# Patient Record
Sex: Female | Born: 1960
Health system: Southern US, Community
[De-identification: ages and names within clinical notes are randomized; demographics above are authoritative.]

## PROBLEM LIST (undated history)

## (undated) DIAGNOSIS — J302 Other seasonal allergic rhinitis: Secondary | ICD-10-CM

## (undated) DIAGNOSIS — G500 Trigeminal neuralgia: Secondary | ICD-10-CM

## (undated) DIAGNOSIS — M1990 Unspecified osteoarthritis, unspecified site: Secondary | ICD-10-CM

## (undated) DIAGNOSIS — Z9851 Tubal ligation status: Secondary | ICD-10-CM

## (undated) DIAGNOSIS — I10 Essential (primary) hypertension: Secondary | ICD-10-CM

## (undated) DIAGNOSIS — F419 Anxiety disorder, unspecified: Secondary | ICD-10-CM

## (undated) DIAGNOSIS — E785 Hyperlipidemia, unspecified: Secondary | ICD-10-CM

## (undated) DIAGNOSIS — N189 Chronic kidney disease, unspecified: Secondary | ICD-10-CM

## (undated) HISTORY — DX: Trigeminal neuralgia: G50.0

## (undated) HISTORY — PX: TUBAL LIGATION: SHX77

## (undated) HISTORY — PX: DENTAL SURGERY: SHX609

## (undated) HISTORY — PX: OTHER SURGICAL HISTORY: SHX169

---

## 2011-04-01 ENCOUNTER — Emergency Department (HOSPITAL_COMMUNITY): Payer: 59

## 2011-04-01 ENCOUNTER — Encounter (HOSPITAL_COMMUNITY): Payer: Self-pay

## 2011-04-01 ENCOUNTER — Emergency Department (HOSPITAL_COMMUNITY)
Admission: EM | Admit: 2011-04-01 | Discharge: 2011-04-01 | Disposition: A | Discharge status: Home / Self Care | Payer: 59 | Attending: Emergency Medicine | Admitting: Emergency Medicine

## 2011-04-01 DIAGNOSIS — E789 Disorder of lipoprotein metabolism, unspecified: Secondary | ICD-10-CM | POA: Insufficient documentation

## 2011-04-01 DIAGNOSIS — E669 Obesity, unspecified: Secondary | ICD-10-CM | POA: Insufficient documentation

## 2011-04-01 DIAGNOSIS — Z79899 Other long term (current) drug therapy: Secondary | ICD-10-CM | POA: Insufficient documentation

## 2011-04-01 DIAGNOSIS — R10814 Left lower quadrant abdominal tenderness: Secondary | ICD-10-CM | POA: Insufficient documentation

## 2011-04-01 DIAGNOSIS — M545 Low back pain, unspecified: Secondary | ICD-10-CM | POA: Insufficient documentation

## 2011-04-01 DIAGNOSIS — R112 Nausea with vomiting, unspecified: Secondary | ICD-10-CM | POA: Insufficient documentation

## 2011-04-01 DIAGNOSIS — R109 Unspecified abdominal pain: Secondary | ICD-10-CM | POA: Insufficient documentation

## 2011-04-01 DIAGNOSIS — R350 Frequency of micturition: Secondary | ICD-10-CM | POA: Insufficient documentation

## 2011-04-01 DIAGNOSIS — N83209 Unspecified ovarian cyst, unspecified side: Secondary | ICD-10-CM | POA: Insufficient documentation

## 2011-04-01 DIAGNOSIS — N949 Unspecified condition associated with female genital organs and menstrual cycle: Secondary | ICD-10-CM | POA: Insufficient documentation

## 2011-04-01 DIAGNOSIS — N200 Calculus of kidney: Secondary | ICD-10-CM | POA: Insufficient documentation

## 2011-04-01 DIAGNOSIS — I10 Essential (primary) hypertension: Secondary | ICD-10-CM | POA: Insufficient documentation

## 2011-04-01 HISTORY — DX: Essential (primary) hypertension: I10

## 2011-04-01 HISTORY — DX: Tubal ligation status: Z98.51

## 2011-04-01 LAB — URINE MICROSCOPIC-ADD ON

## 2011-04-01 LAB — BASIC METABOLIC PANEL
BUN: 8 mg/dL (ref 6–23)
Calcium: 9 mg/dL (ref 8.4–10.5)
Creatinine, Ser: 0.59 mg/dL (ref 0.50–1.10)
GFR calc non Af Amer: >60 mL/min (ref >60)
Glucose, Bld: 167 mg/dL — ABNORMAL HIGH (ref 70–99)
Sodium: 136 mEq/L (ref 135–145)

## 2011-04-01 LAB — CBC
MCV: 88.7 fL (ref 78.0–100.0)
Platelets: 366 10*3/uL (ref 150–400)
RBC: 4.69 MIL/uL (ref 3.87–5.11)
WBC: 14.6 10*3/uL — ABNORMAL HIGH (ref 4.0–10.5)

## 2011-04-01 LAB — DIFFERENTIAL
Eosinophils Absolute: 0 10*3/uL (ref 0.0–0.7)
Lymphocytes Relative: 10 % — ABNORMAL LOW (ref 12–46)
Lymphs Abs: 1.5 10*3/uL (ref 0.7–4.0)
Neutrophils Relative %: 85 % — ABNORMAL HIGH (ref 43–77)

## 2011-04-01 LAB — URINALYSIS, ROUTINE W REFLEX MICROSCOPIC
Bilirubin Urine: NEGATIVE
Leukocytes, UA: NEGATIVE
Nitrite: NEGATIVE
Specific Gravity, Urine: 1.026 (ref 1.005–1.030)
pH: 6 (ref 5.0–8.0)

## 2011-06-26 ENCOUNTER — Encounter (HOSPITAL_COMMUNITY): Payer: Self-pay

## 2011-06-26 ENCOUNTER — Other Ambulatory Visit: Payer: Self-pay

## 2011-06-26 ENCOUNTER — Encounter (HOSPITAL_COMMUNITY)
Admission: RE | Admit: 2011-06-26 | Discharge: 2011-06-26 | Disposition: A | Discharge status: Home / Self Care | Payer: 59 | Source: Ambulatory Visit | Attending: Obstetrics and Gynecology | Admitting: Obstetrics and Gynecology

## 2011-06-26 HISTORY — DX: Hyperlipidemia, unspecified: E78.5

## 2011-06-26 HISTORY — DX: Anxiety disorder, unspecified: F41.9

## 2011-06-26 HISTORY — DX: Chronic kidney disease, unspecified: N18.9

## 2011-06-26 HISTORY — DX: Other seasonal allergic rhinitis: J30.2

## 2011-06-26 HISTORY — DX: Unspecified osteoarthritis, unspecified site: M19.90

## 2011-06-26 LAB — BASIC METABOLIC PANEL
BUN: 8 mg/dL (ref 6–23)
Calcium: 9.9 mg/dL (ref 8.4–10.5)
Creatinine, Ser: 0.64 mg/dL (ref 0.50–1.10)
GFR calc Af Amer: >90 mL/min (ref >90)
GFR calc non Af Amer: >90 mL/min (ref >90)

## 2011-06-26 LAB — CBC
MCH: 32.1 pg (ref 26.0–34.0)
MCHC: 34.2 g/dL (ref 30.0–36.0)
RDW: 13.4 % (ref 11.5–15.5)

## 2011-06-26 NOTE — Patient Instructions (Signed)
   Your procedure is scheduled on:  Wednesday, Oct. 31st  Enter through the Main Entrance of [facility] at: 6:00am Pick up the phone at the desk and dial [phone] and inform us of your arrival.  Please call this number if you have any problems the morning of surgery: [phone]  Remember: Do not eat food after midnight:  Tuesday Do not drink clear liquids after:  Tuesday Take these medicines the morning of surgery with a SIP OF WATER: None.  Withhold Tuesday night dose of Metformin.   Do not wear jewelry, make-up, or FINGER nail polish Do not wear lotions, powders, or perfumes.  You may wear deodorant. Do not shave 48 hours prior to surgery. Do not bring valuables to the hospital.  Patients discharged on the day of surgery will not be allowed to drive home.   Name and phone number of your driver:  husband Kristine Horne  cell [phone]  Remember to use your hibiclens as instructed.Please shower with 1/2 bottle the evening before your surgery and the other 1/2 bottle the morning of surgery.

## 2011-06-26 NOTE — Pre-Procedure Instructions (Signed)
Called Dr Rodman Pickle.  Patient's BP at  beginning of pre-op appointment was 177/104.  Rechecked BP 15 minutes later with large cuff - 171/104.  Rechecked BP again at end of pre-op appointment - 171/99.  Per Dr Delsa Grana instruction, patient instructed to follow up with her primary care physician regarding blood pressure/meds.  Also instructed patient that she will need to come back to Women's week of surgery for [place] to recheck her BP. Appointment made for Tuesday, Oct 30th for me to recheck BP at 7:00am.  Gaylyn Rong made aware of appointment and will inform admitting.  EKG done - NSR.  Instructed to bring albuterol inhaler  DOS but her inhaler was expired.  Will get patient inhaler DOS.  Patient will take all night time meds on Tuesday night but will withhold Tuesday's night dose of Metformin.   Patient will stop fish oil now until after surgery.  Patient informed that unless her BP is controlled, her surgery may be cancelled depending on Tuesday's Oct 30th reading.  Patient advised to take all her BP meds as prescribed.

## 2011-07-07 NOTE — H&P (Signed)
Kristine Horne is an 50 y.o. female, followed by Dr. Ellyn Horne for an 8 cm pelvic cyst.  Repeat pelvic u/s in September confirms the continued presence of an 8 cm simple pelvic cyst and possible arcuate or septate uterus with a possible polyp in the left horn.  She has had slightly irregular, heavy menses, some pelvic pain which may be from this cyst.  Due to the continued presence of this cyst, the patient wishes to proceed with surgical removal before Dr. Ellyn Horne returns from maternity leave.  Pertinent Gynecological History: Last mammogram: normal Date: 2 years ago Last pap: normal Date: 2 months ago OB History: G2, P1011   Menstrual History: Menarche age: 54 No LMP recorded.    Past Medical History  Diagnosis Date  . Hypertension   . S/P tubal ligation   . Diabetes mellitus   . Chronic kidney disease     hx kidney stone  . Hyperlipidemia   . Asthma   . Seasonal allergies   . Anxiety     no meds  . Arthritis     ankles    Past Surgical History  Procedure Date  . Tubal ligation   . Svd     x 1  . Dental surgery     upper bridge    No family history on file.  Social History:  reports that she quit smoking about 26 years ago. Her smoking use included Cigarettes. She has a 12 pack-year smoking history. She has never used smokeless tobacco. She reports that she drinks alcohol. She reports that she does not use illicit drugs.  Allergies:  Allergies  Allergen Reactions  . Erythromycin Nausea And Vomiting  . Tetracyclines & Related Hives  . Ibuprofen (Advil) Swelling  . Penicillins Swelling  . Sulfa Antibiotics Hives    No prescriptions prior to admission    Review of Systems  Respiratory: Negative.   Cardiovascular: Negative.   Gastrointestinal: Negative.   Genitourinary: Negative.     There were no vitals taken for this visit. Physical Exam  Constitutional: She appears well-developed and well-nourished.  Neck: Neck supple. No thyromegaly present.    Cardiovascular: Normal rate, regular rhythm and normal heart sounds.   No murmur heard. Respiratory: Breath sounds normal. No respiratory distress. She has no wheezes.  GI: Soft. She exhibits no distension. There is no tenderness.  Genitourinary: Vagina normal and uterus normal.       Exam by Dr. Ellyn Horne does not mention any palpable mass    No results found for this or any previous visit (from the past 24 hour(s)).  No results found.  Assessment/Plan: Persistent 8 cm midline pelvic cyst, possible endometrial polyp in an arcuate or septate uterus.  All medical and surgical options were discussed.  The surgical procedures, risks, alternatives and chances for resolving symptoms were also discussed.  Will admit on 10-31 for open laparoscopy with removal of pelvic cyst, possible BSO, possible laparotomy, hysteroscopy with D&C, possible resection.  Kristine Horne D 07/07/2011, 6:19 PM

## 2011-07-07 NOTE — Pre-Procedure Instructions (Signed)
Dr Rodman Pickle informed of patient's BP 161/91.  Ok for surgery tomorrow 07/08/11.  Patient reminded to take BP meds.  Patient verbalized understanding.

## 2011-07-08 ENCOUNTER — Encounter (HOSPITAL_COMMUNITY): Payer: Self-pay | Admitting: Anesthesiology

## 2011-07-08 ENCOUNTER — Other Ambulatory Visit: Payer: Self-pay | Admitting: Obstetrics and Gynecology

## 2011-07-08 ENCOUNTER — Ambulatory Visit (HOSPITAL_COMMUNITY)
Admission: RE | Admit: 2011-07-08 | Discharge: 2011-07-08 | Disposition: A | Discharge status: Home / Self Care | Payer: 59 | Source: Ambulatory Visit | Attending: Obstetrics and Gynecology | Admitting: Obstetrics and Gynecology

## 2011-07-08 ENCOUNTER — Ambulatory Visit (HOSPITAL_COMMUNITY): Payer: 59 | Admitting: Anesthesiology

## 2011-07-08 ENCOUNTER — Encounter (HOSPITAL_COMMUNITY)
Admission: RE | Disposition: A | Discharge status: Home / Self Care | Payer: Self-pay | Source: Ambulatory Visit | Attending: Obstetrics and Gynecology

## 2011-07-08 DIAGNOSIS — Z01812 Encounter for preprocedural laboratory examination: Secondary | ICD-10-CM | POA: Insufficient documentation

## 2011-07-08 DIAGNOSIS — Z01818 Encounter for other preprocedural examination: Secondary | ICD-10-CM | POA: Insufficient documentation

## 2011-07-08 DIAGNOSIS — N9489 Other specified conditions associated with female genital organs and menstrual cycle: Secondary | ICD-10-CM | POA: Insufficient documentation

## 2011-07-08 DIAGNOSIS — IMO0002 Reserved for concepts with insufficient information to code with codable children: Secondary | ICD-10-CM | POA: Diagnosis present

## 2011-07-08 DIAGNOSIS — D279 Benign neoplasm of unspecified ovary: Secondary | ICD-10-CM | POA: Insufficient documentation

## 2011-07-08 HISTORY — PX: LAPAROSCOPY: SHX197

## 2011-07-08 HISTORY — PX: OVARIAN CYST REMOVAL: SHX89

## 2011-07-08 LAB — PREGNANCY, URINE: Preg Test, Ur: NEGATIVE

## 2011-07-08 SURGERY — DILATATION & CURETTAGE/HYSTEROSCOPY WITH RESECTOCOPE
Anesthesia: General

## 2011-07-08 MED ORDER — MIDAZOLAM HCL 5 MG/5ML IJ SOLN
INTRAMUSCULAR | Status: DC | PRN
  Administered 2011-07-08: 2 mg via INTRAVENOUS

## 2011-07-08 MED ORDER — BUPIVACAINE HCL (PF) 0.25 % IJ SOLN
INTRAMUSCULAR | Status: DC | PRN
  Administered 2011-07-08: 10 mL

## 2011-07-08 MED ORDER — ONDANSETRON HCL 4 MG/2ML IJ SOLN
INTRAMUSCULAR | Status: AC
  Filled 2011-07-08: qty 2

## 2011-07-08 MED ORDER — NEOSTIGMINE METHYLSULFATE 1 MG/ML IJ SOLN
INTRAMUSCULAR | Status: DC | PRN
  Administered 2011-07-08: 4 mg via INTRAVENOUS

## 2011-07-08 MED ORDER — HYDROCODONE-ACETAMINOPHEN 5-500 MG PO TABS: 1.0000 | ORAL_TABLET | ORAL | Status: AC | PRN

## 2011-07-08 MED ORDER — FENTANYL CITRATE 0.05 MG/ML IJ SOLN
INTRAMUSCULAR | Status: AC
  Administered 2011-07-08: 25 ug via INTRAVENOUS
  Filled 2011-07-08: qty 2

## 2011-07-08 MED ORDER — LIDOCAINE HCL (CARDIAC) 20 MG/ML IV SOLN
INTRAVENOUS | Status: DC | PRN
  Administered 2011-07-08: 50 mg via INTRAVENOUS

## 2011-07-08 MED ORDER — ROCURONIUM BROMIDE 100 MG/10ML IV SOLN
INTRAVENOUS | Status: DC | PRN
  Administered 2011-07-08: 50 mg via INTRAVENOUS

## 2011-07-08 MED ORDER — FENTANYL CITRATE 0.05 MG/ML IJ SOLN
25.0000 ug | INTRAMUSCULAR | Status: DC | PRN
  Administered 2011-07-08 (×3): 25 ug via INTRAVENOUS

## 2011-07-08 MED ORDER — ROCURONIUM BROMIDE 50 MG/5ML IV SOLN
INTRAVENOUS | Status: AC
  Filled 2011-07-08: qty 1

## 2011-07-08 MED ORDER — PROPOFOL 10 MG/ML IV EMUL
INTRAVENOUS | Status: AC
  Filled 2011-07-08: qty 20

## 2011-07-08 MED ORDER — FENTANYL CITRATE 0.05 MG/ML IJ SOLN
INTRAMUSCULAR | Status: DC | PRN
  Administered 2011-07-08: 100 ug via INTRAVENOUS
  Administered 2011-07-08: 150 ug via INTRAVENOUS

## 2011-07-08 MED ORDER — METOCLOPRAMIDE HCL 5 MG/ML IJ SOLN
10.0000 mg | Freq: Once | INTRAMUSCULAR | Status: AC | PRN
  Administered 2011-07-08: 10 mg via INTRAVENOUS

## 2011-07-08 MED ORDER — MEPERIDINE HCL 25 MG/ML IJ SOLN: 6.2500 mg | INTRAMUSCULAR | Status: DC | PRN

## 2011-07-08 MED ORDER — LIDOCAINE HCL (CARDIAC) 20 MG/ML IV SOLN
INTRAVENOUS | Status: AC
  Filled 2011-07-08: qty 5

## 2011-07-08 MED ORDER — LIDOCAINE HCL 2 % IJ SOLN
INTRAMUSCULAR | Status: DC | PRN
  Administered 2011-07-08: 16 mL

## 2011-07-08 MED ORDER — GLYCINE 1.5 % IR SOLN
Status: DC | PRN
  Administered 2011-07-08: 3000 mL

## 2011-07-08 MED ORDER — LACTATED RINGERS IV SOLN
INTRAVENOUS | Status: DC
  Administered 2011-07-08 (×3): via INTRAVENOUS

## 2011-07-08 MED ORDER — FENTANYL CITRATE 0.05 MG/ML IJ SOLN
INTRAMUSCULAR | Status: AC
  Filled 2011-07-08: qty 5

## 2011-07-08 MED ORDER — GLYCOPYRROLATE 0.2 MG/ML IJ SOLN
INTRAMUSCULAR | Status: AC
  Filled 2011-07-08: qty 2

## 2011-07-08 MED ORDER — PROPOFOL 10 MG/ML IV EMUL
INTRAVENOUS | Status: DC | PRN
  Administered 2011-07-08: 200 mg via INTRAVENOUS

## 2011-07-08 MED ORDER — GLYCOPYRROLATE 0.2 MG/ML IJ SOLN
INTRAMUSCULAR | Status: DC | PRN
  Administered 2011-07-08: .8 mg via INTRAVENOUS

## 2011-07-08 MED ORDER — NEOSTIGMINE METHYLSULFATE 1 MG/ML IJ SOLN
INTRAMUSCULAR | Status: AC
  Filled 2011-07-08: qty 10

## 2011-07-08 MED ORDER — MIDAZOLAM HCL 2 MG/2ML IJ SOLN
INTRAMUSCULAR | Status: AC
  Filled 2011-07-08: qty 2

## 2011-07-08 MED ORDER — HYDROMORPHONE HCL 1 MG/ML IJ SOLN: 0.2500 mg | INTRAMUSCULAR | Status: DC | PRN

## 2011-07-08 MED ORDER — METOCLOPRAMIDE HCL 5 MG/ML IJ SOLN
INTRAMUSCULAR | Status: AC
  Administered 2011-07-08: 10 mg via INTRAVENOUS
  Filled 2011-07-08: qty 2

## 2011-07-08 MED ORDER — ONDANSETRON HCL 4 MG/2ML IJ SOLN
INTRAMUSCULAR | Status: DC | PRN
  Administered 2011-07-08: 4 mg via INTRAVENOUS

## 2011-07-08 SURGICAL SUPPLY — 68 items
ABLATOR ENDOMETRIAL BIPOLAR (ABLATOR) IMPLANT
ADH SKN CLS APL DERMABOND .7 (GAUZE/BANDAGES/DRESSINGS) ×2
BAG SPEC RTRVL LRG 6X4 10 (ENDOMECHANICALS) ×2
CABLE HIGH FREQUENCY MONO STRZ (ELECTRODE) IMPLANT
CANISTER SUCTION 2500CC (MISCELLANEOUS) ×3 IMPLANT
CATH FOLEY 3WAY  5CC 16FR (CATHETERS)
CATH FOLEY 3WAY 5CC 16FR (CATHETERS) IMPLANT
CATH ROBINSON RED A/P 16FR (CATHETERS) ×3 IMPLANT
CHLORAPREP W/TINT 26ML (MISCELLANEOUS) ×3 IMPLANT
CLOTH BEACON ORANGE TIMEOUT ST (SAFETY) ×3 IMPLANT
CONTAINER PREFILL 10% NBF 15ML (MISCELLANEOUS) IMPLANT
CONTAINER PREFILL 10% NBF 60ML (FORM) ×6 IMPLANT
DECANTER SPIKE VIAL GLASS SM (MISCELLANEOUS) ×3 IMPLANT
DERMABOND ADVANCED (GAUZE/BANDAGES/DRESSINGS) ×1
DERMABOND ADVANCED .7 DNX12 (GAUZE/BANDAGES/DRESSINGS) ×2 IMPLANT
DRAIN CHANNEL 19F RND (DRAIN) IMPLANT
DRAPE CAMERA CLOSED 9X96 (DRAPES) IMPLANT
DRAPE UTILITY XL STRL (DRAPES) ×6 IMPLANT
DRSG VASELINE 3X18 (GAUZE/BANDAGES/DRESSINGS) IMPLANT
ELECT BLADE 6 FLAT ULTRCLN (ELECTRODE) IMPLANT
ELECT REM PT RETURN 9FT ADLT (ELECTROSURGICAL) ×3
ELECTRODE REM PT RTRN 9FT ADLT (ELECTROSURGICAL) ×2 IMPLANT
FILTER STRAW FLUID ASPIR (MISCELLANEOUS) IMPLANT
GAUZE SPONGE 4X4 12PLY STRL LF (GAUZE/BANDAGES/DRESSINGS) ×6 IMPLANT
GAUZE SPONGE 4X4 16PLY XRAY LF (GAUZE/BANDAGES/DRESSINGS) ×3 IMPLANT
GLOVE BIO SURGEON STRL SZ8 (GLOVE) ×3 IMPLANT
GLOVE ORTHO TXT STRL SZ7.5 (GLOVE) ×3 IMPLANT
GOWN PREVENTION PLUS LG XLONG (DISPOSABLE) ×9 IMPLANT
LOOP ANGLED CUTTING 22FR (CUTTING LOOP) IMPLANT
NDL EPID 17G 5 ECHO TUOHY (NEEDLE) IMPLANT
NDL HYPO 25X1 1.5 SAFETY (NEEDLE) IMPLANT
NDL INSUFFLATION 14GA 120MM (NEEDLE) ×1 IMPLANT
NEEDLE EPID 17G 5 ECHO TUOHY (NEEDLE) IMPLANT
NEEDLE HYPO 25X1 1.5 SAFETY (NEEDLE) IMPLANT
NEEDLE INSUFFLATION 14GA 120MM (NEEDLE) ×3 IMPLANT
NS IRRIG 1000ML POUR BTL (IV SOLUTION) ×3 IMPLANT
PACK ABDOMINAL GYN (CUSTOM PROCEDURE TRAY) ×3 IMPLANT
PACK HYSTEROSCOPY LF (CUSTOM PROCEDURE TRAY) ×3 IMPLANT
PACK LAPAROSCOPY BASIN (CUSTOM PROCEDURE TRAY) ×3 IMPLANT
PAD OB MATERNITY 4.3X12.25 (PERSONAL CARE ITEMS) ×3 IMPLANT
PLUG CATH AND CAP STER (CATHETERS) IMPLANT
POUCH SPECIMEN RETRIEVAL 10MM (ENDOMECHANICALS) ×2 IMPLANT
SCALPEL HARMONIC ACE (MISCELLANEOUS) ×2 IMPLANT
SET CYSTO W/LG BORE CLAMP LF (SET/KITS/TRAYS/PACK) IMPLANT
SET IRRIG TUBING LAPAROSCOPIC (IRRIGATION / IRRIGATOR) ×2 IMPLANT
SLEEVE Z-THREAD 5X100MM (TROCAR) IMPLANT
SOLUTION ELECTROLUBE (MISCELLANEOUS) IMPLANT
SPONGE LAP 18X18 X RAY DECT (DISPOSABLE) ×6 IMPLANT
STAPLER VISISTAT 35W (STAPLE) IMPLANT
SUT PDS AB 0 CTX 60 (SUTURE) IMPLANT
SUT PROLENE 4 0 KS NDL (SUTURE) IMPLANT
SUT PROLENE 4 0 KS NEEDLE (SUTURE) IMPLANT
SUT SILK 2 0 FSL 18 (SUTURE) IMPLANT
SUT VIC AB 0 CT1 27 (SUTURE) ×6
SUT VIC AB 0 CT1 27XBRD ANBCTR (SUTURE) ×4 IMPLANT
SUT VIC AB 3-0 CTX 36 (SUTURE) ×6 IMPLANT
SUT VICRYL 0 TIES 12 18 (SUTURE) IMPLANT
SUT VICRYL 0 UR6 27IN ABS (SUTURE) ×4 IMPLANT
SUT VICRYL 4-0 PS2 18IN ABS (SUTURE) ×3 IMPLANT
SYR 3ML LL SCALE MARK (SYRINGE) IMPLANT
SYR CONTROL 10ML LL (SYRINGE) IMPLANT
TOWEL OR 17X24 6PK STRL BLUE (TOWEL DISPOSABLE) ×6 IMPLANT
TRAY FOLEY CATH 14FR (SET/KITS/TRAYS/PACK) ×3 IMPLANT
TROCAR 12M 150ML BLUNT (TROCAR) ×2 IMPLANT
TROCAR Z-THREAD FIOS 11X100 BL (TROCAR) IMPLANT
TROCAR Z-THREAD FIOS 5X100MM (TROCAR) ×3 IMPLANT
WARMER LAPAROSCOPE (MISCELLANEOUS) ×3 IMPLANT
WATER STERILE IRR 1000ML POUR (IV SOLUTION) ×3 IMPLANT

## 2011-07-08 NOTE — Anesthesia Preprocedure Evaluation (Signed)
Anesthesia Evaluation   Patient awake  General Assessment Comment  Reviewed: Allergy & Precautions, H&P , NPO status , Patient's Chart, lab work & pertinent test results  Airway Mallampati: II TM Distance: >3 FB Neck ROM: full    Dental No notable dental hx. (+) Partial Upper   Pulmonary asthma  Controlled with inhalers         Cardiovascular hypertension, On Medications and On Home Beta Blockers regular Normal Took Metoprolol last night   Neuro/Psych Negative Neurological ROS  Negative Psych ROS   GI/Hepatic negative GI ROS Neg liver ROS    Endo/Other  Well Controlled, Type 2, Oral Hypoglycemic AgentsMorbid obesity  Renal/GU Hx/o renal calculi  Genitourinary negative   Musculoskeletal   Abdominal   Peds  Hematology negative hematology ROS (+)   Anesthesia Other Findings   Reproductive/Obstetrics negative OB ROS                           Anesthesia Physical Anesthesia Plan  ASA: III  Anesthesia Plan: General   Post-op Pain Management:    Induction:   Airway Management Planned:   Additional Equipment:   Intra-op Plan:   Post-operative Plan:   Informed Consent: I have reviewed the patients History and Physical, chart, labs and discussed the procedure including the risks, benefits and alternatives for the proposed anesthesia with the patient or authorized representative who has indicated his/her understanding and acceptance.   Dental Advisory Given  Plan Discussed with: Anesthesiologist, CRNA and Surgeon  Anesthesia Plan Comments:         Anesthesia Quick Evaluation

## 2011-07-08 NOTE — Op Note (Signed)
Preoperative diagnosis: Adnexal cyst, possible endometrial polyp Postop diagnosis: Left adnexal cyst, small amount of pelvic adhesions, normal endometrial cavity Procedure: Open laparoscopy with adhesiolysis and removal of left adnexal cyst, hysteroscopy Surgeon: Lavina Hamman M.D. Anesthesia: Gen. Endotracheal tube Findings: She had an essentially normal abdomen. There is a small amount of omentum adherent to the right uterine cornu. There was a large left adnexal cyst which appeared to be coming from the distal left fallopian tube, possibly a large hydrosalpinx. Both tubes and ovaries appeared normal and there was evidence of prior tubal ligation. Appendix and upper abdomen were normal. On hysteroscopy the endometrial cavity was normal. There was a question of an arcuate versus a septate uterus on ultrasound and neither of these were identified on laparoscopy or hysteroscopy. Estimated blood loss: Minimal Fluid deficit: Through the hysteroscope fluid deficit was minimal Specimens: Left adnexal cyst for routine pathology Complications: None  Procedure in detail: The patient was taken to the operating room and placed in the dorsosupine position. General anesthesia was induced. She was placed in mobile stirrups and arms were tucked to her sides. Abdomen perineum and vagina were then prepped and draped in the usual sterile fashion and a Foley catheter was inserted. Infraumbilical skin was infiltrated with quarter percent Marcaine a 3 cm horizontal incision was made. Fascia was then identified and elevated and entered sharply. Peritoneal cavity was then entered bluntly. The fascial incision was extended bilaterally for short distance. A pursestring suture of 0 Vicryl was placed and a Hassan cannula was inserted and the balloon was elevated to obtain a good seal. A 10 mm laparoscope was inserted and good visualization was achieved after the abdomen was insufflated with CO2. A 5 mm port was placed on the  left side under direct visualization. Inspection revealed the above-mentioned findings. A harmonic scalpel was used to remove the omental adhesion to the right uterine cornu. The left adnexal cyst appeared to be coming from the distal left fallopian tube. This was able to be removed from the remainder of the adnexa with the Harmonic Scalpel without difficulty. The cyst was too large to be placed in an Endobag. The cyst was punctured and about 240 cc of fluid was aspirated. The cyst was then able to be placed in an Endobag using a 5 mm scope from the left for visualization. The umbilical trocar was removed and the cyst was removed easily in the bag. Prior to doing this all pedicles were inspected and found to be hemostatic. No other pelvic pathology was noted. The 5 mm port was removed. The pursestring suture was tied. A an extra figure-of-eight suture of 0 Vicryl was placed and the umbilical fascia. Skin incisions were closed with interrupted subcuticular sutures of 4-0 Vicryl followed by Dermabond.  Attention was turned hysteroscopy. Legs were elevated in the stirrups. A Graves speculum was inserted into the vagina and the anterior lip of the cervix was grasped with a single-tooth tenaculum. Paracervical block was performed with a total of 16 cc of 2% plain lidocaine. Uterus sounded to 8 cc. The cervix was easily dilated to size 23 dilator. The observer hysteroscope was inserted and good visualization was achieved. Both uterine cornu were easily identified in both tubal ostia were also identified. The endometrial cavity was normal with no significant tissue identified, no septum noticed. No polyp or fibroids were noted. The hysteroscope was removed. The single-tooth tenaculum was removed and bleeding was controlled with pressure. All instruments were then removed from the vagina. The patient was  taken down from stirrups. She was awakened in the operating room and taken to the recovery room in stable condition  after tolerating the procedure well. Counts were correct and she had PAS hose on throughout the procedure.

## 2011-07-08 NOTE — Anesthesia Procedure Notes (Signed)
Procedure Name: Intubation Date/Time: 07/08/2011 7:32 AM Performed by: Madison Hickman Pre-anesthesia Checklist: Patient identified, Emergency Drugs available, Suction available, Timeout performed and Patient being monitored Patient Re-evaluated:Patient Re-evaluated prior to inductionOxygen Delivery Method: Circle System Utilized Preoxygenation: Pre-oxygenation with 100% oxygen Intubation Type: IV induction Ventilation: Mask ventilation without difficulty Laryngoscope Size: Mac and 3 Grade View: Grade II Tube type: Oral Tube size: 7.0 mm Number of attempts: 1 Airway Equipment and Method: stylet Placement Confirmation: ETT inserted through vocal cords under direct vision,  breath sounds checked- equal and bilateral and positive ETCO2 Secured at: 22 cm Tube secured with: Tape Dental Injury: Teeth and Oropharynx as per pre-operative assessment

## 2011-07-08 NOTE — Progress Notes (Signed)
Date of Initial H&P: 07-07-11  History reviewed, patient examined, no change in status, stable for surgery.

## 2011-07-08 NOTE — Anesthesia Postprocedure Evaluation (Signed)
  Anesthesia Post-op Note  Patient: Kristine Horne  Procedure(s) Performed:  DILATATION & CURETTAGE/HYSTEROSCOPY WITH RESECTOSCOPE - polyectomy, poss. BSO, poss. laparotomy; LAPAROSCOPY OPERATIVE; OVARIAN CYSTECTOMY  Patient Location: PACU  Anesthesia Type: General  Level of Consciousness: awake, alert  and oriented  Airway and Oxygen Therapy: Patient Spontanous Breathing and Patient connected to face mask oxygen  Post-op Pain: none  Post-op Assessment: Post-op Vital signs reviewed, Patient's Cardiovascular Status Stable, Respiratory Function Stable, Patent Airway, No signs of Nausea or vomiting and Pain level controlled  Post-op Vital Signs: Reviewed and stable  Complications: No apparent anesthesia complications

## 2011-07-08 NOTE — Transfer of Care (Signed)
Immediate Anesthesia Transfer of Care Note  Patient: Kristine Horne  Procedure(s) Performed:  DILATATION & CURETTAGE/HYSTEROSCOPY WITH RESECTOSCOPE - polyectomy, poss. BSO, poss. laparotomy; LAPAROSCOPY OPERATIVE; OVARIAN CYSTECTOMY  Patient Location: PACU  Anesthesia Type: General  Level of Consciousness: awake, alert  and sedated  Airway & Oxygen Therapy: Patient Spontanous Breathing and Patient connected to nasal cannula oxygen  Post-op Assessment: Report given to PACU RN and Post -op Vital signs reviewed and stable  Post vital signs: Reviewed and stable  Complications: No apparent anesthesia complications

## 2011-07-09 ENCOUNTER — Encounter (HOSPITAL_COMMUNITY): Payer: Self-pay | Admitting: Obstetrics and Gynecology

## 2011-12-12 DIAGNOSIS — J45909 Unspecified asthma, uncomplicated: Secondary | ICD-10-CM

## 2011-12-12 HISTORY — DX: Unspecified asthma, uncomplicated: J45.909

## 2015-09-10 DIAGNOSIS — S0300XA Dislocation of jaw, unspecified side, initial encounter: Secondary | ICD-10-CM | POA: Insufficient documentation

## 2015-09-10 HISTORY — DX: Dislocation of jaw, unspecified side, initial encounter: S03.00XA

## 2016-04-02 DIAGNOSIS — E1169 Type 2 diabetes mellitus with other specified complication: Secondary | ICD-10-CM | POA: Insufficient documentation

## 2016-04-02 HISTORY — DX: Mixed hyperlipidemia: E11.69

## 2016-07-03 DIAGNOSIS — E669 Obesity, unspecified: Secondary | ICD-10-CM

## 2016-07-03 HISTORY — DX: Obesity, unspecified: E66.9

## 2016-12-01 DIAGNOSIS — R195 Other fecal abnormalities: Secondary | ICD-10-CM

## 2016-12-01 HISTORY — DX: Other fecal abnormalities: R19.5

## 2017-01-27 DIAGNOSIS — Z860101 Personal history of adenomatous and serrated colon polyps: Secondary | ICD-10-CM

## 2017-01-27 HISTORY — DX: Personal history of adenomatous and serrated colon polyps: Z86.0101

## 2017-07-01 DIAGNOSIS — E66812 Obesity, class 2: Secondary | ICD-10-CM | POA: Insufficient documentation

## 2017-07-01 HISTORY — DX: Obesity, class 2: E66.812

## 2019-06-06 ENCOUNTER — Other Ambulatory Visit: Payer: Self-pay | Admitting: Family Medicine

## 2019-06-06 DIAGNOSIS — Z1231 Encounter for screening mammogram for malignant neoplasm of breast: Secondary | ICD-10-CM

## 2019-07-26 ENCOUNTER — Ambulatory Visit
Admission: RE | Admit: 2019-07-26 | Discharge: 2019-07-26 | Disposition: A | Discharge status: Home / Self Care | Payer: 59 | Source: Ambulatory Visit | Attending: Family Medicine | Admitting: Family Medicine

## 2019-07-26 ENCOUNTER — Other Ambulatory Visit: Payer: Self-pay | Admitting: Family Medicine

## 2019-07-26 ENCOUNTER — Other Ambulatory Visit: Payer: Self-pay

## 2019-07-26 DIAGNOSIS — Z1231 Encounter for screening mammogram for malignant neoplasm of breast: Secondary | ICD-10-CM

## 2020-01-24 ENCOUNTER — Other Ambulatory Visit: Payer: Self-pay

## 2020-01-24 ENCOUNTER — Ambulatory Visit (INDEPENDENT_AMBULATORY_CARE_PROVIDER_SITE_OTHER): Payer: 59 | Admitting: Neurology

## 2020-01-24 ENCOUNTER — Encounter: Payer: Self-pay | Admitting: Neurology

## 2020-01-24 VITALS — BP 158/88 | HR 72 | Ht 64.0 in | Wt 195.5 lb

## 2020-01-24 DIAGNOSIS — G500 Trigeminal neuralgia: Secondary | ICD-10-CM | POA: Diagnosis not present

## 2020-01-24 MED ORDER — OXTELLAR XR 300 MG PO TB24: 600.0000 mg | ORAL_TABLET | Freq: Every evening | ORAL | 11 refills | Status: DC

## 2020-01-24 MED ORDER — PREGABALIN 200 MG PO CAPS: 200.0000 mg | ORAL_CAPSULE | Freq: Three times a day (TID) | ORAL | 5 refills | Status: DC

## 2020-01-24 NOTE — Progress Notes (Signed)
PATIENT: Kristine Horne DOB: December 28, 1960  Chief Complaint  Patient presents with  . Trigeminal Neuralgia    She is here with her husband, Kristine Horne. She is her for right-sided trigeminal neuralgia. She is currently taking gabapentin 300mg , three capsules TID which is some helpful. She has some drowsiness with this dosage. She failed Tregetrol and Trileptal.   . PCP    Angelica Ran, MD     HISTORICAL  Kristine Horne is a 59 year old female, seen in request by her primary care physician Dr. Maricela Bo, Marena Chancy for evaluation of facial pain, she is accompanied by her husband at today's clinical visit only 19 2021.  I have reviewed and summarized the referring note from the referring physician.  She has past medical history of hypertension, hyperlipidemia  In 2017, she had her first round of right facial pain, radiating pain from right ear to right jaw, lasting for 6 months, during that period of time, she was seen by different dentist, without clear dental etiology found, eventually she was diagnosed with trigeminal neuralgia  She began to have recurrent right facial pain again since January 2021, deep constant daily right ear achy pain, frequent radiating pain like electric burst radiating from right ear to right jaw, mainly involving right lower teeth, during intense pain, occasionally spillover to right upper teeth, worsening by talking, brushing her teeth, chewing,  She was started on gabapentin 300 mg titrating dose since March 2021, currently taking 3 tablets 3 times a day for total of 2700 mg a day, make her very sleepy but with suboptimal control of her facial pain, which is present 75% of the time, moderate degree 7 out of 10  Previously she has tried Tegretol, complains of significant GI side effect, Trileptal without significant benefit,  She works at Therapist, art for [employer], was on the phone all the time, complains of difficulty due to her right facial  pain.  Laboratory evaluation showed A1c of 6.4, CMP showed elevated glucose 147, normal TSH,  I of the brain with without contrast from [facility] on Jan 13, 2020 showed no acute abnormality, small vessel disease.  There was a vessel adjacent to the left   REVIEW OF SYSTEMS: Full 14 system review of systems performed and notable only for as above All other review of systems were negative.  ALLERGIES: Allergies  Allergen Reactions  . Erythromycin Nausea And Vomiting  . Tetracyclines & Related Hives  . Ibuprofen [Ibuprofen] Swelling  . Penicillins Swelling  . Sulfa Antibiotics Hives  . Tegretol [Carbamazepine] Nausea And Vomiting    HOME MEDICATIONS: Current Outpatient Medications  Medication Sig Dispense Refill  . acetaminophen (TYLENOL) 500 MG tablet Take 1,000 mg by mouth every 6 (six) hours as needed. Patient takes for pain     . albuterol (PROVENTIL HFA;VENTOLIN HFA) 108 (90 BASE) MCG/ACT inhaler Inhale 2 puffs into the lungs every 6 (six) hours as needed. Takes for shortness of breath     . amLODipine (NORVASC) 10 MG tablet Take 10 mg by mouth daily.      . cetirizine (ZYRTEC) 10 MG tablet Take 10 mg by mouth daily.      . fenofibrate 160 MG tablet Take 160 mg by mouth daily.      . fish oil-omega-3 fatty acids 1000 MG capsule Take 1 g by mouth daily.      Marland Kitchen gabapentin (NEURONTIN) 300 MG capsule Take 900 mg by mouth 3 (three) times daily.    Marland Kitchen  lisinopril (PRINIVIL,ZESTRIL) 20 MG tablet Take 20 mg by mouth daily.      . metoprolol succinate (TOPROL-XL) 25 MG 24 hr tablet Take 50 mg by mouth daily.     . simvastatin (ZOCOR) 20 MG tablet Take 20 mg by mouth daily.       No current facility-administered medications for this visit.    PAST MEDICAL HISTORY: Past Medical History:  Diagnosis Date  . Anxiety    no meds  . Arthritis    ankles  . Asthma   . Chronic kidney disease    hx kidney stone  . Diabetes mellitus   . Hyperlipidemia   . Hypertension   . S/P tubal  ligation   . Seasonal allergies   . Trigeminal neuralgia    right    PAST SURGICAL HISTORY: Past Surgical History:  Procedure Laterality Date  . DENTAL SURGERY     upper bridge  . LAPAROSCOPY  07/08/2011   Procedure: LAPAROSCOPY OPERATIVE;  Surgeon: Clarene Duke, MD;  Location: [facility];  Service: Gynecology;  Laterality: N/A;  . OVARIAN CYST REMOVAL  07/08/2011   Procedure: OVARIAN CYSTECTOMY;  Surgeon: Clarene Duke, MD;  Location: [facility];  Service: Gynecology;  Laterality: N/A;  . svd     x 1  . TUBAL LIGATION      FAMILY HISTORY: Family History  Problem Relation Age of Onset  . Congestive Heart Failure Mother   . Heart attack Father   . Pulmonary embolism Father   . Diabetes Maternal Grandmother   . COPD Maternal Grandfather   . Diabetes Paternal Grandmother   . Diabetes Paternal Grandfather     SOCIAL HISTORY: Social History   Socioeconomic History  . Marital status: Married    Spouse name: Not on file  . Number of children: 1  . Years of education: 67  . Highest education level: High school graduate  Occupational History  . Not on file  Tobacco Use  . Smoking status: Former Smoker    Packs/day: 1.00    Years: 12.00    Pack years: 12.00    Types: Cigarettes    Quit date: 1987    Years since quitting: 34.4  . Smokeless tobacco: Never Used  Substance and Sexual Activity  . Alcohol use: Yes    Comment: occasional  . Drug use: No  . Sexual activity: Yes    Birth control/protection: Surgical  Other Topics Concern  . Not on file  Social History Narrative   Lives at home with husband.   Right-handed.   Caffeine use: 2 cups per day.   Social Determinants of Health   Financial Resource Strain:   . Difficulty of Paying Living Expenses:   Food Insecurity:   . Worried About Charity fundraiser in the Last Year:   . Arboriculturist in the Last Year:   Transportation Needs:   . Film/video editor (Medical):   Marland Kitchen Lack of Transportation  (Non-Medical):   Physical Activity:   . Days of Exercise per Week:   . Minutes of Exercise per Session:   Stress:   . Feeling of Stress :   Social Connections:   . Frequency of Communication with Friends and Family:   . Frequency of Social Gatherings with Friends and Family:   . Attends Religious Services:   . Active Member of Clubs or Organizations:   . Attends Archivist Meetings:   Marland Kitchen Marital Status:   Intimate Partner Violence:   .  Fear of Current or Ex-Partner:   . Emotionally Abused:   Marland Kitchen Physically Abused:   . Sexually Abused:      PHYSICAL EXAM   Vitals:   01/24/20 0824  BP: (!) 158/88  Pulse: 72  Weight: 195 lb 8 oz (88.7 kg)  Height: 5\' 4"  (1.626 m)    Not recorded      Body mass index is 33.56 kg/m.  PHYSICAL EXAMNIATION:  Gen: NAD, conversant, well nourised, well groomed                     Cardiovascular: Regular rate rhythm, no peripheral edema, warm, nontender. Eyes: Conjunctivae clear without exudates or hemorrhage Neck: Supple, no carotid bruits. Pulmonary: Clear to auscultation bilaterally   NEUROLOGICAL EXAM:  MENTAL STATUS: Speech:    Speech is normal; fluent and spontaneous with normal comprehension.  Cognition:     Orientation to time, place and person     Normal recent and remote memory     Normal Attention span and concentration     Normal Language, naming, repeating,spontaneous speech     Fund of knowledge   CRANIAL NERVES: CN II: Visual fields are full to confrontation. Pupils are round equal and briskly reactive to light. CN III, IV, VI: extraocular movement are normal. No ptosis. CN V: Facial sensation is intact to light touch CN VII: Face is symmetric with normal eye closure  CN VIII: Hearing is normal to causal conversation. CN IX, X: Phonation is normal. CN XI: Head turning and shoulder shrug are intact  MOTOR: There is no pronator drift of out-stretched arms. Muscle bulk and tone are normal. Muscle strength is  normal.  REFLEXES: Reflexes are 2+ and symmetric at the biceps, triceps, knees, and ankles. Plantar responses are flexor.  SENSORY: Intact to light touch, pinprick and vibratory sensation are intact in fingers and toes.  COORDINATION: There is no trunk or limb dysmetria noted.  GAIT/STANCE: Posture is normal. Gait is steady with normal steps, base, arm swing, and turning. Heel and toe walking are normal. Tandem gait is normal.  Romberg is absent.   DIAGNOSTIC DATA (LABS, IMAGING, TESTING) - I reviewed patient records, labs, notes, testing and imaging myself where available.   ASSESSMENT AND PLAN  Kristine Horne is a 59 y.o. female    Right trigeminal neuralgia  MRI of the brain showed no significant structural lesion  Laboratory evaluation to document baseline  She reported suboptimal control with gabapentin up 2700 mg, will try Lyrica 200 mg 3 times a day  Add on Trileptal XR 300 mg, titrating to 600 mg every night Marcial Pacas, M.D. Ph.D.  [facility] [address], [place], [place] [address] Ph: [phone] Fax: [phone]  CC: Angelica Ran, MD

## 2020-01-24 NOTE — Patient Instructions (Signed)
[email or web address]  Coverage and Payment Assistance Download or Activate a Co-Pay Card

## 2020-01-25 ENCOUNTER — Telehealth: Payer: Self-pay | Admitting: Neurology

## 2020-01-25 LAB — COMPREHENSIVE METABOLIC PANEL
ALT: 23 IU/L (ref 0–32)
AST: 21 IU/L (ref 0–40)
Albumin/Globulin Ratio: 2.1 (ref 1.2–2.2)
Albumin: 5 g/dL — ABNORMAL HIGH (ref 3.8–4.9)
Alkaline Phosphatase: 70 IU/L (ref 48–121)
BUN/Creatinine Ratio: 32 — ABNORMAL HIGH (ref 9–23)
BUN: 25 mg/dL — ABNORMAL HIGH (ref 6–24)
Bilirubin Total: 0.3 mg/dL (ref 0.0–1.2)
CO2: 25 mmol/L (ref 20–29)
Calcium: 9.9 mg/dL (ref 8.7–10.2)
Chloride: 104 mmol/L (ref 96–106)
Creatinine, Ser: 0.79 mg/dL (ref 0.57–1.00)
GFR calc Af Amer: 95 mL/min/{1.73_m2} (ref >59)
GFR calc non Af Amer: 82 mL/min/{1.73_m2} (ref >59)
Globulin, Total: 2.4 g/dL (ref 1.5–4.5)
Glucose: 138 mg/dL — ABNORMAL HIGH (ref 65–99)
Potassium: 4.8 mmol/L (ref 3.5–5.2)
Sodium: 142 mmol/L (ref 134–144)
Total Protein: 7.4 g/dL (ref 6.0–8.5)

## 2020-01-25 LAB — CBC WITH DIFFERENTIAL/PLATELET
Basophils Absolute: 0.1 10*3/uL (ref 0.0–0.2)
Basos: 1 %
EOS (ABSOLUTE): 0.2 10*3/uL (ref 0.0–0.4)
Eos: 2 %
Hematocrit: 42.7 % (ref 34.0–46.6)
Hemoglobin: 14.2 g/dL (ref 11.1–15.9)
Immature Grans (Abs): 0 10*3/uL (ref 0.0–0.1)
Immature Granulocytes: 0 %
Lymphocytes Absolute: 2.3 10*3/uL (ref 0.7–3.1)
Lymphs: 33 %
MCH: 31.6 pg (ref 26.6–33.0)
MCHC: 33.3 g/dL (ref 31.5–35.7)
MCV: 95 fL (ref 79–97)
Monocytes Absolute: 0.6 10*3/uL (ref 0.1–0.9)
Monocytes: 9 %
Neutrophils Absolute: 3.8 10*3/uL (ref 1.4–7.0)
Neutrophils: 55 %
Platelets: 275 10*3/uL (ref 150–450)
RBC: 4.49 x10E6/uL (ref 3.77–5.28)
RDW: 12.7 % (ref 11.7–15.4)
WBC: 6.9 10*3/uL (ref 3.4–10.8)

## 2020-01-25 LAB — VITAMIN B12: Vitamin B-12: 410 pg/mL (ref 232–1245)

## 2020-01-25 LAB — RPR: RPR Ser Ql: NONREACTIVE

## 2020-01-25 NOTE — Telephone Encounter (Signed)
Pt called stating that her OXcarbazepine ER (OXTELLAR XR) 300 MG TB24 is needing a PA and they informed her that it may take several days and she is wanting to know if she is to continue taking her other medications as usual until the PA is received. Please advise.

## 2020-01-25 NOTE — Telephone Encounter (Signed)
PA for Oxtellar XR 300mg  started on covermymeds (key: [id number]). Pt has coverage through [employer] [phone]). [id number]. I received notification of denial stating that Oxtellar XR is excluded from coverage with the plan.   I called to update the patient. I told her once she picks up the new prescription of pregabalin then she should stop her gabapentin. I told her I would discuss the Oxtellar denial with Dr. Krista Blue and call her back.

## 2020-01-29 ENCOUNTER — Telehealth: Payer: Self-pay | Admitting: Neurology

## 2020-01-29 DIAGNOSIS — G500 Trigeminal neuralgia: Secondary | ICD-10-CM

## 2020-01-29 MED ORDER — PIMOZIDE 2 MG PO TABS: 4.0000 mg | ORAL_TABLET | Freq: Two times a day (BID) | ORAL | 3 refills | Status: DC

## 2020-01-29 MED ORDER — LEVETIRACETAM 1000 MG PO TABS: 1000.0000 mg | ORAL_TABLET | Freq: Two times a day (BID) | ORAL | 6 refills | Status: DC

## 2020-01-29 MED ORDER — OXCARBAZEPINE 300 MG PO TABS: 300.0000 mg | ORAL_TABLET | Freq: Two times a day (BID) | ORAL | 6 refills | Status: DC

## 2020-01-29 NOTE — Addendum Note (Signed)
Addended by: Marcial Pacas on: 01/29/2020 10:16 AM   Modules accepted: Orders

## 2020-01-29 NOTE — Telephone Encounter (Signed)
Patient reports pregabalin 200mg , one capsule TID is not managing her discomfort from her trigeminal neuralgia. She has failed Tegretol and Trileptal. Limited response to gabapentin 2700mg  daily. Oxtellar XR is excluded from her insurance plan and is unaffordable.

## 2020-01-29 NOTE — Addendum Note (Signed)
Addended by: Desmond Lope on: 01/29/2020 09:57 AM   Modules accepted: Orders

## 2020-01-29 NOTE — Telephone Encounter (Signed)
Pt has called to inform pregabalin (LYRICA) 200 MG capsule is not helping her, please call

## 2020-01-29 NOTE — Telephone Encounter (Signed)
I called patient, she is only taking Lyrica 200 mg 3 times a day, still complains of frequent trigeminal pain, her insurance would not cover extended release Trileptal  We will add on regular Trileptal 300 mg twice a day, prescription was called to her pharmacy,  Other option would be lamotrigine, orap if she does not respond to current treatment, or even referral to [facility] for gamma knife evaluation  She is advised to call back in 1 week for progress report

## 2020-02-06 MED ORDER — OXCARBAZEPINE 300 MG PO TABS: 600.0000 mg | ORAL_TABLET | Freq: Two times a day (BID) | ORAL | 6 refills | Status: DC

## 2020-02-06 MED ORDER — PREGABALIN 300 MG PO CAPS: 300.0000 mg | ORAL_CAPSULE | Freq: Three times a day (TID) | ORAL | 5 refills | Status: DC

## 2020-02-06 NOTE — Telephone Encounter (Signed)
Please check with patient  1.has she got Oxtellar yet, if we still pending insurance preauthorization, may consider Trileptal 150 mg titrating to 2 tablets twice a day,   2. also check with her if Lyrica has helped her any, If lyrica is helpful, we have more room to increase the dose.  3. Other options are baclofen, lamotrigine, Keppra,  4.  May even consider gamma knife, neurosurgery referral to [facility]

## 2020-02-06 NOTE — Telephone Encounter (Signed)
I have called patient, she continue have moderate pain on a daily basis, especially early morning, difficulty brushing her teeth, difficulty eating, despite titrating dose of Lyrica 200 mg 3 times a day, Trileptal 300 mg twice a day, she had this kind of pain for 6 months, previous episode in 2017 also last about 6 months, she works from home, she does complains of dizziness, drowsiness with medication, but tolerable, is not operating a vehicle  1, increase Lyrica to 300 mg 3 times a day, Trileptal 2 300 mg 2 tablets twice a day, new prescription was called in 2 refer her to [facility] neurosurgeon Dr. Josie Dixon. Salomon Fick, MD, PhD  [facility] Address: [place], Neurosurgery - [address], [place], [place] [address] Phone: [phone]

## 2020-02-06 NOTE — Telephone Encounter (Signed)
Oxtellar XR is not an option for her. The medication is excluded from her insurance plan.  She is taking the following:  1) Lyrica 200mg , one tablet TID 2) Trileptal 300mg , one tablet BID  She is getting mild relief with this medication regimen. She is still having significant difficulty eating normal foods and brushing her teeth. She is "living off soft foods like mashed potatoes".  She does not feels her symptoms are under good control.

## 2020-02-06 NOTE — Telephone Encounter (Signed)
Pt called to request a CB to discuss medication states it is not helping her and would like to know what the  Next step would be.

## 2020-02-06 NOTE — Addendum Note (Signed)
Addended by: Marcial Pacas on: 02/06/2020 05:23 PM   Modules accepted: Orders

## 2020-03-05 ENCOUNTER — Encounter: Payer: Self-pay | Admitting: Neurology

## 2020-03-05 ENCOUNTER — Other Ambulatory Visit: Payer: Self-pay

## 2020-03-05 ENCOUNTER — Ambulatory Visit (INDEPENDENT_AMBULATORY_CARE_PROVIDER_SITE_OTHER): Payer: 59 | Admitting: Neurology

## 2020-03-05 VITALS — BP 152/62 | HR 80 | Ht 64.0 in | Wt 207.0 lb

## 2020-03-05 DIAGNOSIS — G500 Trigeminal neuralgia: Secondary | ICD-10-CM

## 2020-03-05 NOTE — Progress Notes (Signed)
PATIENT: Kristine Horne DOB: Apr 08, 1961  Chief Complaint  Patient presents with  . Trigeminal Neuralgia    She is here with her husband, Elenore Rota. She is currently taking Trileptal 600mg  BID and Lyrica 200mg  TID (her insurance would not pay for the 300mg  capsules). She does have some drowsiness and dizziness from the medications. Right now her pain is under good control. She has a pending appt at [facility] on 04/03/20.      HISTORICAL  Kristine Horne is a 58 year old female, seen in request by her primary care physician Dr. Maricela Bo, Marena Chancy for evaluation of facial pain, she is accompanied by her husband at today's clinical visit only 19 2021.  I have reviewed and summarized the referring note from the referring physician.  She has past medical history of hypertension, hyperlipidemia  In 2017, she had her first round of right facial pain, radiating pain from right ear to right jaw, lasting for 6 months, during that period of time, she was seen by different dentist, without clear dental etiology found, eventually she was diagnosed with trigeminal neuralgia  She began to have recurrent right facial pain again since January 2021, deep constant daily right ear achy pain, frequent radiating pain like electric burst radiating from right ear to right jaw, mainly involving right lower teeth, during intense pain, occasionally spillover to right upper teeth, worsening by talking, brushing her teeth, chewing,  She was started on gabapentin 300 mg titrating dose since March 2021, currently taking 3 tablets 3 times a day for total of 2700 mg a day, make her very sleepy but with suboptimal control of her facial pain, which is present 75% of the time, moderate degree 7 out of 10  Previously she has tried Tegretol, complains of significant GI side effect, Trileptal without significant benefit,  She works at Therapist, art for [employer], was on the phone all the time, complains of difficulty due to  her right facial pain.  Laboratory evaluation showed A1c of 6.4, CMP showed elevated glucose 147, normal TSH,  MRI of the brain with without contrast from [facility] on Jan 13, 2020 showed no acute abnormality, small vessel disease.  There was a vessel adjacent to the left   UPDATE March 05 2020: Multiple phone calls since initial visit on Jan 24, 2020 due to severe prolonged left facial pain, eventually was under control with high-dose of Lyrica 200 twice a day, Trileptal 300 mg 2 tablets twice a day,  But she complains significant side effect, drowsiness, drunk feeling, sleepiness, could barely do anything else I would not go to work.  Laboratory evaluation on Jan 24, 2020, normal CBC, RPR, B12, CMP showed sodium 142  REVIEW OF SYSTEMS: Full 14 system review of systems performed and notable only for as above All other review of systems were negative.  ALLERGIES: Allergies  Allergen Reactions  . Erythromycin Nausea And Vomiting  . Tetracyclines & Related Hives  . Ibuprofen [Ibuprofen] Swelling  . Penicillins Swelling  . Sulfa Antibiotics Hives  . Tegretol [Carbamazepine] Nausea And Vomiting    HOME MEDICATIONS: Current Outpatient Medications  Medication Sig Dispense Refill  . acetaminophen (TYLENOL) 500 MG tablet Take 1,000 mg by mouth every 6 (six) hours as needed. Patient takes for pain     . albuterol (PROVENTIL HFA;VENTOLIN HFA) 108 (90 BASE) MCG/ACT inhaler Inhale 2 puffs into the lungs every 6 (six) hours as needed. Takes for shortness of breath     . amLODipine (NORVASC) 10  MG tablet Take 10 mg by mouth daily.      . cetirizine (ZYRTEC) 10 MG tablet Take 10 mg by mouth daily.      . fenofibrate 160 MG tablet Take 160 mg by mouth daily.      . fish oil-omega-3 fatty acids 1000 MG capsule Take 1 g by mouth daily.      Marland Kitchen lisinopril (PRINIVIL,ZESTRIL) 20 MG tablet Take 20 mg by mouth daily.      . metoprolol succinate (TOPROL-XL) 25 MG 24 hr tablet Take 50 mg by mouth daily.      . Oxcarbazepine (TRILEPTAL) 300 MG tablet Take 2 tablets (600 mg total) by mouth 2 (two) times daily. 120 tablet 6  . pregabalin (LYRICA) 200 MG capsule Take 200 mg by mouth in the morning, at noon, and at bedtime.    . simvastatin (ZOCOR) 20 MG tablet Take 20 mg by mouth daily.       No current facility-administered medications for this visit.    PAST MEDICAL HISTORY: Past Medical History:  Diagnosis Date  . Anxiety    no meds  . Arthritis    ankles  . Asthma   . Chronic kidney disease    hx kidney stone  . Diabetes mellitus   . Hyperlipidemia   . Hypertension   . S/P tubal ligation   . Seasonal allergies   . Trigeminal neuralgia    right    PAST SURGICAL HISTORY: Past Surgical History:  Procedure Laterality Date  . DENTAL SURGERY     upper bridge  . LAPAROSCOPY  07/08/2011   Procedure: LAPAROSCOPY OPERATIVE;  Surgeon: Clarene Duke, MD;  Location: [facility];  Service: Gynecology;  Laterality: N/A;  . OVARIAN CYST REMOVAL  07/08/2011   Procedure: OVARIAN CYSTECTOMY;  Surgeon: Clarene Duke, MD;  Location: [facility];  Service: Gynecology;  Laterality: N/A;  . svd     x 1  . TUBAL LIGATION      FAMILY HISTORY: Family History  Problem Relation Age of Onset  . Congestive Heart Failure Mother   . Heart attack Father   . Pulmonary embolism Father   . Diabetes Maternal Grandmother   . COPD Maternal Grandfather   . Diabetes Paternal Grandmother   . Diabetes Paternal Grandfather     SOCIAL HISTORY: Social History   Socioeconomic History  . Marital status: Married    Spouse name: Not on file  . Number of children: 1  . Years of education: 87  . Highest education level: High school graduate  Occupational History  . Not on file  Tobacco Use  . Smoking status: Former Smoker    Packs/day: 1.00    Years: 12.00    Pack years: 12.00    Types: Cigarettes    Quit date: 1987    Years since quitting: 34.5  . Smokeless tobacco: Never Used  Substance and Sexual  Activity  . Alcohol use: Yes    Comment: occasional  . Drug use: No  . Sexual activity: Yes    Birth control/protection: Surgical  Other Topics Concern  . Not on file  Social History Narrative   Lives at home with husband.   Right-handed.   Caffeine use: 2 cups per day.   Social Determinants of Health   Financial Resource Strain:   . Difficulty of Paying Living Expenses:   Food Insecurity:   . Worried About Charity fundraiser in the Last Year:   . [employer]  in the Last Year:   Transportation Needs:   . Film/video editor (Medical):   Marland Kitchen Lack of Transportation (Non-Medical):   Physical Activity:   . Days of Exercise per Week:   . Minutes of Exercise per Session:   Stress:   . Feeling of Stress :   Social Connections:   . Frequency of Communication with Friends and Family:   . Frequency of Social Gatherings with Friends and Family:   . Attends Religious Services:   . Active Member of Clubs or Organizations:   . Attends Archivist Meetings:   Marland Kitchen Marital Status:   Intimate Partner Violence:   . Fear of Current or Ex-Partner:   . Emotionally Abused:   Marland Kitchen Physically Abused:   . Sexually Abused:      PHYSICAL EXAM   Vitals:   03/05/20 0726  BP: (!) 152/62  Pulse: 80  Weight: 207 lb (93.9 kg)  Height: 5\' 4"  (1.626 m)   Not recorded     Body mass index is 35.53 kg/m.  PHYSICAL EXAMNIATION:  Gen: NAD, conversant, well nourised, well groomed                     Cardiovascular: Regular rate rhythm, no peripheral edema, warm, nontender.   NEUROLOGICAL EXAM:  MENTAL STATUS: Speech/cognition: Awake alert oriented to history taking and care of conversation CRANIAL NERVES: CN II: Visual fields are full to confrontation. Pupils are round equal and briskly reactive to light. CN III, IV, VI: extraocular movement are normal. No ptosis. CN V: Facial sensation is intact to light touch CN VII: Face is symmetric with normal eye closure  CN VIII:  Hearing is normal to causal conversation. CN IX, X: Phonation is normal. CN XI: Head turning and shoulder shrug are intact  MOTOR: There is no pronator drift of out-stretched arms. Muscle bulk and tone are normal. Muscle strength is normal.  REFLEXES: Reflexes are 2+ and symmetric at the biceps, triceps, knees, and ankles. Plantar responses are flexor.  SENSORY: Intact to light touch,   COORDINATION: There is no trunk or limb dysmetria noted.  GAIT/STANCE: Need push-up to get up from seated position, cautious, difficulty with tendon   DIAGNOSTIC DATA (LABS, IMAGING, TESTING) - I reviewed patient records, labs, notes, testing and imaging myself where available.   ASSESSMENT AND PLAN  Kristine Horne is a 59 y.o. female    Right trigeminal neuralgia  MRI of the brain showed no significant structural lesion  Severe prolonged left V3 pain started since January 2021,  Her pain was eventually able to brought under control by Lyrica 200 mg 3 times a day, and Trileptal 300 mg 2 tablets twice a day, but she complains significant side effect  She is on schedule for neurosurgeon Dr. Angelene Giovanni at [facility] on April 03, 2020  I have advised her gradually tapering down the dose of Lyrica, and Trileptal,  Laboratory evaluation CMP, CBC,    Marcial Pacas, M.D. Ph.D.  [facility] [address], [place], [place] [address] Ph: [phone] Fax: [phone]  CC: Angelica Ran, MD

## 2020-03-06 LAB — COMPREHENSIVE METABOLIC PANEL
ALT: 21 IU/L (ref 0–32)
AST: 19 IU/L (ref 0–40)
Albumin/Globulin Ratio: 2 (ref 1.2–2.2)
Albumin: 4.5 g/dL (ref 3.8–4.9)
Alkaline Phosphatase: 89 IU/L (ref 48–121)
BUN/Creatinine Ratio: 25 — ABNORMAL HIGH (ref 9–23)
BUN: 19 mg/dL (ref 6–24)
Bilirubin Total: 0.2 mg/dL (ref 0.0–1.2)
CO2: 24 mmol/L (ref 20–29)
Calcium: 9.2 mg/dL (ref 8.7–10.2)
Chloride: 106 mmol/L (ref 96–106)
Creatinine, Ser: 0.75 mg/dL (ref 0.57–1.00)
GFR calc Af Amer: 101 mL/min/{1.73_m2} (ref >59)
GFR calc non Af Amer: 88 mL/min/{1.73_m2} (ref >59)
Globulin, Total: 2.3 g/dL (ref 1.5–4.5)
Glucose: 156 mg/dL — ABNORMAL HIGH (ref 65–99)
Potassium: 4.7 mmol/L (ref 3.5–5.2)
Sodium: 143 mmol/L (ref 134–144)
Total Protein: 6.8 g/dL (ref 6.0–8.5)

## 2020-03-06 LAB — CBC
Hematocrit: 39.6 % (ref 34.0–46.6)
Hemoglobin: 13.2 g/dL (ref 11.1–15.9)
MCH: 32 pg (ref 26.6–33.0)
MCHC: 33.3 g/dL (ref 31.5–35.7)
MCV: 96 fL (ref 79–97)
Platelets: 232 10*3/uL (ref 150–450)
RBC: 4.12 x10E6/uL (ref 3.77–5.28)
RDW: 12.1 % (ref 11.7–15.4)
WBC: 7.3 10*3/uL (ref 3.4–10.8)

## 2020-04-07 HISTORY — PX: CEREBRAL MICROVASCULAR DECOMPRESSION: SHX1328

## 2020-04-24 ENCOUNTER — Telehealth: Payer: Self-pay | Admitting: Neurology

## 2020-04-24 NOTE — Telephone Encounter (Signed)
Pt states when she was in hospital for surgery it was suggested that she has a sleep study. Pt is requesting  a referrral from Dr Krista Blue for a sleep consult.  Please call

## 2020-04-24 NOTE — Telephone Encounter (Signed)
I spoke to the patient. She has requested her PCP to send over the referral for a sleep consultation.

## 2020-05-22 ENCOUNTER — Ambulatory Visit (INDEPENDENT_AMBULATORY_CARE_PROVIDER_SITE_OTHER): Payer: 59 | Admitting: Neurology

## 2020-05-22 ENCOUNTER — Encounter: Payer: Self-pay | Admitting: Neurology

## 2020-05-22 VITALS — BP 142/86 | HR 89 | Ht 64.0 in | Wt 205.2 lb

## 2020-05-22 DIAGNOSIS — G4734 Idiopathic sleep related nonobstructive alveolar hypoventilation: Secondary | ICD-10-CM | POA: Diagnosis not present

## 2020-05-22 DIAGNOSIS — R0683 Snoring: Secondary | ICD-10-CM | POA: Diagnosis not present

## 2020-05-22 DIAGNOSIS — G4719 Other hypersomnia: Secondary | ICD-10-CM | POA: Diagnosis not present

## 2020-05-22 DIAGNOSIS — R0681 Apnea, not elsewhere classified: Secondary | ICD-10-CM

## 2020-05-22 DIAGNOSIS — R351 Nocturia: Secondary | ICD-10-CM

## 2020-05-22 DIAGNOSIS — E669 Obesity, unspecified: Secondary | ICD-10-CM

## 2020-05-22 DIAGNOSIS — Z8669 Personal history of other diseases of the nervous system and sense organs: Secondary | ICD-10-CM

## 2020-05-22 NOTE — Patient Instructions (Signed)

## 2020-05-22 NOTE — Progress Notes (Signed)
Subjective:    Patient ID: Kristine Horne is a 59 y.o. female.  HPI     [name], MD, PhD [facility] [address] [place], [place] [address]  Dear Dr. Maricela Bo,  I saw your patient, Kristine Horne, upon your kind request in my sleep clinic today for initial consultation of her sleep disorder, in particular, concern for underlying obstructive sleep apnea.  The patient is unaccompanied today. As you know, Kristine Horne is a 59 year old right-handed woman with an underlying medical history of hypertension, diabetes, trigeminal neuralgia (followed by Dr. Krista Blue), with status post microvascular decompression on the right side on 04/11/2020 [facility], Dr. Arlan Organ) and obesity, who reports snoring and excessive daytime somnolence as well as oxygen desaturations while she was in the hospital last month.  She reports that the nurse told her that she would drop her oxygen levels into the 80s whenever she would fall asleep.  She did have some supplemental oxygen during her hospital stay.  I reviewed your office note from 04/25/2020.  Her Epworth sleepiness score is 7/24, fatigue severity score is 44 out of 63.  Since her trigeminal neuralgia surgery she has had increase in fatigue but prior to her surgery when she was still on multiple medications for her facial pain she had significant balance issues and sleepiness.  Her husband has noticed in the past that she had pauses in her breathing and she is known to snore for years.  She has never sought evaluation of her sleep disturbance.  She is familiar with the sleep apnea diagnosis as her husband has sleep apnea and uses a CPAP machine successfully.  She does not have recurrent morning headaches but does have occasional sinus headaches.  She has done rather well after her surgical decompression for trigeminal neuralgia.  She is off of Trileptal and Lyrica.  She lives with her husband.  They have 1 grown son.  Bedtime is  generally between 930 and 1030 and rise time between 530 and 7.  Now that she is working from home, she can sleep till approximately 7 AM.  She quit smoking in 1986, drinks alcohol rarely, drinks caffeine in the form of soda, about 16 ounce per day on average.  She was diagnosed with asthma as an adult.  She does not have to use an inhaler on a regular basis.  She was working on weight loss until she started having more issues with her trigeminal neuralgia, she is motivated to work on weight loss again now that she feels better.  She does have difficulty maintaining sleep.  She has nocturia about once per average night.  Her Past Medical History Is Significant For: Past Medical History:  Diagnosis Date  . Anxiety    no meds  . Arthritis    ankles  . Asthma   . Chronic kidney disease    hx kidney stone  . Diabetes mellitus   . Hyperlipidemia   . Hypertension   . S/P tubal ligation   . Seasonal allergies   . Trigeminal neuralgia    right    Her Past Surgical History Is Significant For: Past Surgical History:  Procedure Laterality Date  . DENTAL SURGERY     upper bridge  . LAPAROSCOPY  07/08/2011   Procedure: LAPAROSCOPY OPERATIVE;  Surgeon: Clarene Duke, MD;  Location: [facility];  Service: Gynecology;  Laterality: N/A;  . OVARIAN CYST REMOVAL  07/08/2011   Procedure: OVARIAN CYSTECTOMY;  Surgeon: Sherren Mocha  Rich Reining, MD;  Location: [facility];  Service: Gynecology;  Laterality: N/A;  . svd     x 1  . TUBAL LIGATION      Her Family History Is Significant For: Family History  Problem Relation Age of Onset  . Congestive Heart Failure Mother   . Heart attack Father   . Pulmonary embolism Father   . Diabetes Maternal Grandmother   . COPD Maternal Grandfather   . Diabetes Paternal Grandmother   . Diabetes Paternal Grandfather     Her Social History Is Significant For: Social History   Socioeconomic History  . Marital status: Married    Spouse name: Not on file  . Number of  children: 1  . Years of education: 64  . Highest education level: High school graduate  Occupational History  . Not on file  Tobacco Use  . Smoking status: Former Smoker    Packs/day: 1.00    Years: 12.00    Pack years: 12.00    Types: Cigarettes    Quit date: 1987    Years since quitting: 34.7  . Smokeless tobacco: Never Used  Substance and Sexual Activity  . Alcohol use: Yes    Comment: occasional  . Drug use: No  . Sexual activity: Yes    Birth control/protection: Surgical  Other Topics Concern  . Not on file  Social History Narrative   Lives at home with husband.   Right-handed.   Caffeine use: 2 cups per day.   Social Determinants of Health   Financial Resource Strain:   . Difficulty of Paying Living Expenses: Not on file  Food Insecurity:   . Worried About Charity fundraiser in the Last Year: Not on file  . Ran Out of Food in the Last Year: Not on file  Transportation Needs:   . Lack of Transportation (Medical): Not on file  . Lack of Transportation (Non-Medical): Not on file  Physical Activity:   . Days of Exercise per Week: Not on file  . Minutes of Exercise per Session: Not on file  Stress:   . Feeling of Stress : Not on file  Social Connections:   . Frequency of Communication with Friends and Family: Not on file  . Frequency of Social Gatherings with Friends and Family: Not on file  . Attends Religious Services: Not on file  . Active Member of Clubs or Organizations: Not on file  . Attends Archivist Meetings: Not on file  . Marital Status: Not on file    Her Allergies Are:  Allergies  Allergen Reactions  . Erythromycin Nausea And Vomiting  . Tetracyclines & Related Hives  . Ibuprofen [Ibuprofen] Swelling  . Penicillins Swelling  . Sulfa Antibiotics Hives  . Tegretol [Carbamazepine] Nausea And Vomiting  :   Her Current Medications Are:  Outpatient Encounter Medications as of 05/22/2020  Medication Sig  . acetaminophen (TYLENOL) 500  MG tablet Take 1,000 mg by mouth every 6 (six) hours as needed. Patient takes for pain   . amLODipine (NORVASC) 10 MG tablet Take 10 mg by mouth daily.    . cetirizine (ZYRTEC) 10 MG tablet Take 10 mg by mouth daily.    . fenofibrate 160 MG tablet Take 160 mg by mouth daily.    . fish oil-omega-3 fatty acids 1000 MG capsule Take 1 g by mouth daily.    Marland Kitchen lisinopril (PRINIVIL,ZESTRIL) 20 MG tablet Take 20 mg by mouth daily.    . metoprolol succinate (TOPROL-XL)  25 MG 24 hr tablet Take 50 mg by mouth daily.   . simvastatin (ZOCOR) 20 MG tablet Take 20 mg by mouth daily.    . pregabalin (LYRICA) 200 MG capsule Take 200 mg by mouth in the morning, at noon, and at bedtime. (Patient not taking: Reported on 05/22/2020)  . [DISCONTINUED] albuterol (PROVENTIL HFA;VENTOLIN HFA) 108 (90 BASE) MCG/ACT inhaler Inhale 2 puffs into the lungs every 6 (six) hours as needed. Takes for shortness of breath   . [DISCONTINUED] Oxcarbazepine (TRILEPTAL) 300 MG tablet Take 2 tablets (600 mg total) by mouth 2 (two) times daily.   No facility-administered encounter medications on file as of 05/22/2020.  :  Review of Systems:  Out of a complete 14 point review of systems, all are reviewed and negative with the exception of these symptoms as listed below: Review of Systems  Neurological:       Here for sleep consult. No prior sleep study. Pt reports she does snore. Pt reports she just recently had had surgery for trigeminal neuralgia and since her fatigue and tiredness have increased. While she was in the ICU her o2 levels dropped and sleep consult was reccommended.  Epworth Sleepiness Scale 0= would never doze 1= slight chance of dozing 2= moderate chance of dozing 3= high chance of dozing  Sitting and reading:1 Watching TV:1 Sitting inactive in a public place (ex. Theater or meeting):0 As a passenger in a car for an hour without a break:2 Lying down to rest in the afternoon:2 Sitting and talking to  someone:0 Sitting quietly after lunch (no alcohol):1 In a car, while stopped in traffic:0 Total:7      Objective:  Neurological Exam  Physical Exam Physical Examination:   Vitals:   05/22/20 1544  BP: (!) 142/86  Pulse: 89  SpO2: 97%    General Examination: The patient is a very pleasant 59 y.o. female in no acute distress. She appears well-developed and well-nourished and well groomed.   HEENT: Normocephalic, atraumatic, well-healing scar behind right ear, pupils are equal, round and reactive to light, extraocular tracking is good without limitation to gaze excursion or nystagmus noted. Hearing is grossly intact. Face is symmetric with normal facial animation. Speech is clear with no dysarthria noted. There is no hypophonia. There is no lip, neck/head, jaw or voice tremor. Neck is supple with full range of passive and active motion. There are no carotid bruits on auscultation. Oropharynx exam reveals: mild mouth dryness, adequate dental hygiene and mild airway crowding, due to small airway entry, slightly elongated uvula, tonsillar size of 1+ bilaterally, slightly wider tongue.  Mallampati class II.  Neck circumference of 14-7/8 inches.  She has a mild overbite.  Tongue protrudes centrally in palate elevates symmetrically.  Chest: Clear to auscultation without wheezing, rhonchi or crackles noted.  Heart: S1+S2+0, regular and normal without murmurs, rubs or gallops noted.   Abdomen: Soft, non-tender and non-distended with normal bowel sounds appreciated on auscultation.  Extremities: There is no pitting edema in the distal lower extremities bilaterally.   Skin: Warm and dry without trophic changes noted.   Musculoskeletal: exam reveals no obvious joint deformities, tenderness or joint swelling or erythema.   Neurologically:  Mental status: The patient is awake, alert and oriented in all 4 spheres. Her immediate and remote memory, attention, language skills and fund of knowledge  are appropriate. There is no evidence of aphasia, agnosia, apraxia or anomia. Speech is clear with normal prosody and enunciation. Thought process is linear. Mood is  normal and affect is normal.  Cranial nerves II - XII are as described above under HEENT exam.  Motor exam: Normal bulk, strength and tone is noted. There is no tremor. Fine motor skills and coordination: grossly intact.  Cerebellar testing: No dysmetria or intention tremor. There is no truncal or gait ataxia.  Sensory exam: intact to light touch in the upper and lower extremities.  Gait, station and balance: She stands easily. No veering to one side is noted. No leaning to one side is noted. Posture is age-appropriate and stance is narrow based. Gait shows normal stride length and normal pace. No problems turning are noted.   Assessment and Plan:  In summary, Kristine Horne is a very pleasant 59 y.o.-year old female with an underlying medical history of hypertension, diabetes, trigeminal neuralgia (followed by Dr. Krista Blue), with status post microvascular decompression on the right side on 04/11/2020 [facility], Dr. Arlan Organ) and obesity, whose history and physical exam are concerning for obstructive sleep apnea (OSA). I had a long chat with the patient about my findings and the diagnosis of OSA, its prognosis and treatment options. We talked about medical treatments, surgical interventions and non-pharmacological approaches. I explained in particular the risks and ramifications of untreated moderate to severe OSA, especially with respect to developing cardiovascular disease down the Road, including congestive heart failure, difficult to treat hypertension, cardiac arrhythmias, or stroke. Even type 2 diabetes has, in part, been linked to untreated OSA. Symptoms of untreated OSA include daytime sleepiness, memory problems, mood irritability and mood disorder such as depression and anxiety, lack of energy, as well as recurrent headaches, especially  morning headaches. We talked about trying to maintain a healthy lifestyle in general, as well as the importance of weight control. We also talked about the importance of good sleep hygiene. I recommended the following at this time: sleep study.  I explained the sleep test procedure to the patient and also outlined possible surgical and non-surgical treatment options of OSA, including the use of a custom-made dental device (which would require a referral to a specialist dentist or oral surgeon), upper airway surgical options, such as traditional UPPP or a novel less invasive surgical option in the form of Inspire hypoglossal nerve stimulation (which would involve a referral to an ENT surgeon). I also explained the CPAP treatment option to the patient, who indicated that she would be willing to try CPAP if the need arises. I explained the importance of being compliant with PAP treatment, not only for insurance purposes but primarily to improve Her symptoms, and for the patient's long term health benefit, including to reduce Her cardiovascular risks. I answered all her questions today and the patient was in agreement. I plan to see her back after the sleep study is completed and encouraged her to call with any interim questions, concerns, problems or updates.   Thank you very much for allowing me to participate in the care of this nice patient. If I can be of any further assistance to you please do not hesitate to call me at [phone].  Sincerely,   [name], MD, PhD

## 2020-06-12 ENCOUNTER — Ambulatory Visit (INDEPENDENT_AMBULATORY_CARE_PROVIDER_SITE_OTHER): Payer: 59 | Admitting: Neurology

## 2020-06-12 DIAGNOSIS — G4733 Obstructive sleep apnea (adult) (pediatric): Secondary | ICD-10-CM | POA: Diagnosis not present

## 2020-06-12 DIAGNOSIS — G4719 Other hypersomnia: Secondary | ICD-10-CM

## 2020-06-12 DIAGNOSIS — R351 Nocturia: Secondary | ICD-10-CM

## 2020-06-12 DIAGNOSIS — Z8669 Personal history of other diseases of the nervous system and sense organs: Secondary | ICD-10-CM

## 2020-06-12 DIAGNOSIS — G4734 Idiopathic sleep related nonobstructive alveolar hypoventilation: Secondary | ICD-10-CM

## 2020-06-12 DIAGNOSIS — R0683 Snoring: Secondary | ICD-10-CM

## 2020-06-12 DIAGNOSIS — E669 Obesity, unspecified: Secondary | ICD-10-CM

## 2020-06-12 DIAGNOSIS — R0681 Apnea, not elsewhere classified: Secondary | ICD-10-CM

## 2020-06-13 NOTE — Procedures (Signed)
Sleep Study Report   Patient Information     First Name: Kristine Last Name: Horne ID: [id number]  Birth Date: [birth year] Age: 59 Gender: Female  Referring Provider: Angelica Ran, MD BMI: 35.0 (W=205 lb, H=5' 4'')  Neck Circ.:  15 '' Epworth:  7/24   Sleep Study Information    Study Date: 06/12/20 S/H/A Version: [id number] / 4.2.1023 / 69  History:    59 year old woman with a history of hypertension, diabetes, trigeminal neuralgia (followed by Dr. Krista Blue), with status post microvascular decompression on the right side on 04/11/2020, and obesity, who reports snoring and excessive daytime somnolence as well as oxygen desaturations while she was in the hospital recently. Summary & Diagnosis:     OSA Recommendations:     This home sleep test demonstrates severe obstructive sleep apnea with a total AHI of 30.4/hour and O2 nadir of 80%. Treatment with positive airway pressure is recommended. The patient will be advised to proceed with an autoPAP titration/trial at home for now.  A full night titration study can be considered for optimization of treatment settings, if needed down the road. Alternative treatment options will be limited secondary to the severity of her sleep disordered breathing.  Concomitant weight loss is recommended. Please note, that untreated obstructive sleep apnea may carry additional perioperative morbidity. Patients with significant obstructive sleep apnea should receive perioperative PAP therapy and the surgeons and particularly the anesthesiologist should be informed of the diagnosis and the severity of the sleep disordered breathing. The patient should be cautioned not to drive, work at heights, or operate dangerous or heavy equipment when tired or sleepy. Review and reiteration of good sleep hygiene measures should be pursued with any patient. Other causes of the patient's symptoms, including circadian rhythm disturbances, an underlying mood disorder, medication effect and/or an  underlying medical problem cannot be ruled out based on this test. Clinical correlation is recommended. The patient and her referring provider will be notified of the test results. The patient will be seen in follow up in sleep clinic at [facility].  I certify that I have reviewed the raw data recording prior to the issuance of this report in accordance with the standards of the American Academy of Sleep Medicine (AASM).  [name], MD, PhD [facility]) Diplomat, ABPN (Neurology and Sleep)         Sleep Summary  Oxygen Saturation Statistics   Start Study Time: End Study Time: Total Recording Time:          10:08:45 PM 6:24:29 AM   8 h, 15 min  Total Sleep Time % REM of Sleep Time:  6 h, 54 min  28.3    Mean: 92 Minimum: 80 Maximum: 98  Mean of Desaturations Nadirs (%):   90  Oxygen Desaturation. %:  4-9 10-20 >20 Total  Events Number Total   111  2 98.2 1.8  0 0.0  113 100.0  Oxygen Saturation: <90 <=88 <85 <80 <70  Duration (minutes): Sleep % 11.9 2.9 6.5 0.6 1.6 0.2 0.0 0.0 0.0 0.0     Respiratory Indices      Total Events REM NREM All Night  pRDI: pAHI 3%: ODI 4%: pAHIc 3%: % CSR: pAHI 4%:  219  209 113   15 0.0 121 48.5 44.4 24.3 4.1 25.3 24.9 13.4 1.4 31.8 30.4 16.4 2.2 17.6       Pulse Rate Statistics during Sleep (BPM)      Mean: 72 Minimum: 54 Maximum: 98  Indices are calculated using technically valid sleep time of 6 h, 52 min.                                              pAHI=30.4                                                           Mild              Moderate                    Severe                                                 5              15                    30   Body Position Statistics  Position Supine Prone Right Left Non-Supine  Sleep (min) 2.0 229.5 56.5 118.5 404.5  Sleep % 0.5 55.4 13.6 28.6 97.6  pRDI N/A 28.9 17.0 44.2 31.7  pAHI 3% N/A 26.6 17.0 43.7 30.3  ODI 4% N/A 14.7 5.3  25.4 16.5               Left   Prone  Right  Supine    Snoring Statistics Snoring Level (dB) >40 >50 >60 >70 >80 >Threshold (45)  Sleep (min) 325.3 66.8 2.9 0.0 0.0 135.6  Sleep % 78.5 16.1 0.7 0.0 0.0 32.7    Mean: 44 dB Sleep Stages Chart                         Wake  Sleep      Wake  16.38  %    Sleep  83.62  %   Total:  100.00  %                                                       REM  Light  Deep      REM  28.35  %    Light  54.40  %    Deep  17.25  %   Total:  100.00  %                                 Sleep/Wake States  Sleep Stages  Sleep Latency (min):  REM Latency (min):  Number of Wakes:   38   76   15

## 2020-06-13 NOTE — Addendum Note (Signed)
Addended by: [name] on: 06/13/2020 06:35 PM   Modules accepted: Orders

## 2020-06-13 NOTE — Progress Notes (Signed)
Patient referred by Dr. Maricela Bo, seen by me on 05/22/20, HST on 06/12/20.    Please call and notify the patient that the recent home sleep test showed obstructive sleep apnea in the severe range. I recommend treatment in the form of autoPAP, which means, that we don't have to bring her in for a sleep study with CPAP, but will let start using an autoPAP machine at home, through a DME company (of her choice, or as per insurance requirement). The DME representative will educate her on how to use the machine, how to put the mask on, etc. I have placed an order in the chart. Please send referral, talk to patient, send report to referring MD. We will need a FU in sleep clinic for 10 weeks post-PAP set up, please arrange that with me or one of our NPs. Thanks,   [name], MD, PhD [facility])

## 2020-06-18 ENCOUNTER — Telehealth: Payer: Self-pay

## 2020-06-18 NOTE — Telephone Encounter (Signed)
-----   Message from [name], MD sent at 06/13/2020  6:35 PM EDT ----- Patient referred by Dr. Maricela Bo, seen by me on 05/22/20, HST on 06/12/20.    Please call and notify the patient that the recent home sleep test showed obstructive sleep apnea in the severe range. I recommend treatment in the form of autoPAP, which means, that we don't have to bring her in for a sleep study with CPAP, but will let start using an autoPAP machine at home, through a DME company (of her choice, or as per insurance requirement). The DME representative will educate her on how to use the machine, how to put the mask on, etc. I have placed an order in the chart. Please send referral, talk to patient, send report to referring MD. We will need a FU in sleep clinic for 10 weeks post-PAP set up, please arrange that with me or one of our NPs. Thanks,   [name], MD, PhD [facility])

## 2020-06-18 NOTE — Telephone Encounter (Signed)
I called pt. I advised pt that Dr. Rexene Alberts reviewed their sleep study results and found that pt has severe osa. Dr. Rexene Alberts recommends that pt start an auto pap at home. I reviewed PAP compliance expectations with the pt. Pt is agreeable to starting an auto-PAP. I advised pt that an order will be sent to a DME, [facility], and [facility] will call the pt within about one week after they file with the pt's insurance. [facility] will show the pt how to use the machine, fit for masks, and troubleshoot the auto-PAP if needed. A follow up appt was made for insurance purposes with Judson Roch, NP as scheduled on 09/09/2020 at 7:45am. Pt verbalized understanding to arrive 15 minutes early and bring their auto-PAP. A letter with all of this information in it will be mailed to the pt as a reminder. I verified with the pt that the address we have on file is correct. Pt verbalized understanding of results. Pt had no questions at this time but was encouraged to call back if questions arise. I have sent the order to [facility] and have received confirmation that they have received the order.  *Pt requested to use [facility].*

## 2020-06-19 ENCOUNTER — Institutional Professional Consult (permissible substitution): Payer: 59 | Admitting: Neurology

## 2020-09-09 ENCOUNTER — Ambulatory Visit: Payer: BC Managed Care – PPO | Admitting: Neurology

## 2020-09-09 ENCOUNTER — Encounter: Payer: Self-pay | Admitting: Neurology

## 2020-09-09 DIAGNOSIS — G4733 Obstructive sleep apnea (adult) (pediatric): Secondary | ICD-10-CM | POA: Diagnosis not present

## 2020-09-09 DIAGNOSIS — Z9989 Dependence on other enabling machines and devices: Secondary | ICD-10-CM | POA: Diagnosis not present

## 2020-09-09 HISTORY — DX: Obstructive sleep apnea (adult) (pediatric): G47.33

## 2020-09-09 NOTE — Progress Notes (Addendum)
PATIENT: Kristine Horne DOB: 1961-06-09  REASON FOR VISIT: follow up HISTORY FROM: patient  HISTORY OF PRESENT ILLNESS: Today 09/09/20  HISTORY  [name] is a 60 year old female, seen in request by her primary care physician Dr. Angelica Ran for evaluation of facial pain, she is accompanied by her husband at today's clinical visit only 19 2021.  I have reviewed and summarized the referring note from the referring physician.  She has past medical history of hypertension, hyperlipidemia  In 2017, she had her first round of right facial pain, radiating pain from right ear to right jaw, lasting for 6 months, during that period of time, she was seen by different dentist, without clear dental etiology found, eventually she was diagnosed with trigeminal neuralgia  She began to have recurrent right facial pain again since January 2021, deep constant daily right ear achy pain, frequent radiating pain like electric burst radiating from right ear to right jaw, mainly involving right lower teeth, during intense pain, occasionally spillover to right upper teeth, worsening by talking, brushing her teeth, chewing,  She was started on gabapentin 300 mg titrating dose since March 2021, currently taking 3 tablets 3 times a day for total of 2700 mg a day, make her very sleepy but with suboptimal control of her facial pain, which is present 75% of the time, moderate degree 7 out of 10  Previously she has tried Tegretol, complains of significant GI side effect, Trileptal without significant benefit,  She works at [employer]rap[employer], art for [employer], was on the phone all the time, complains of difficulty due to her right facial pain.  Laboratory evaluation showed A1c of 6.4, CMP showed elevated glucose 147, normal TSH,  MRI of the brain with without contrast from [facility] on Jan 13, 2020 showed no acute abnormality, small vessel disease.  There was a vessel adjacent to the left    UPDATE March 05 2020: Multiple phone calls since initial visit on Jan 24, 2020 due to severe prolonged left facial pain, eventually was under control with high-dose of Lyrica 200 twice a day, Trileptal 300 mg 2 tablets twice a day,  But she complains significant side effect, drowsiness, drunk feeling, sleepiness, could barely do anything else I would not go to work.  Laboratory evaluation on Jan 24, 2020, normal CBC, RPR, B12, CMP showed sodium 142  Update September 09, 2020 SS: Had microvascular decompression for TN 04/11/20 with Dr. Arlan Organ.  Had excellent surgical outcome, is pain-free, has weaned off her medications.  Had sleep evaluation with Dr Rexene Alberts was found to have severe OSA, started on CPAP.  Doing great on CPAP. "I love it".  Uses it every night, cannot sleep without it.  Has much more energy, feels more rested.  Wears full facemask. No leak of mask.  Has no reported problems.  Insurance paid 100%. Is pleased with how well she is doing. Before could barely go to work due to medication side effect, fatigue. ESS 2. Download not available at initial note, will addend note with data once available. CPAP machine, equipment is working perfect.   Addendum 09/11/19 SS: Review of CPAP data from 08/11/20-09/09/20 reveals excellent compliance, greater than 4 hours 30/30 days at 100%.  Average usage 9 hours and 4 minutes.  Minimum pressure 6 cm water, maximum pressure 12 cm water.  AHI 0.6, leak in the 95th percentile 9.2.  REVIEW OF SYSTEMS: Out of a complete 14 system review of symptoms, the patient complains only  of the following symptoms, and all other reviewed systems are negative.  Sleep apnea  ALLERGIES: Allergies  Allergen Reactions  . Erythromycin Nausea And Vomiting  . Tetracyclines & Related Hives  . Ibuprofen [Ibuprofen] Swelling  . Penicillins Swelling  . Sulfa Antibiotics Hives  . Tegretol [Carbamazepine] Nausea And Vomiting    HOME MEDICATIONS: Outpatient Medications Prior to  Visit  Medication Sig Dispense Refill  . acetaminophen (TYLENOL) 500 MG tablet Take 1,000 mg by mouth every 6 (six) hours as needed. Patient takes for pain    . amLODipine (NORVASC) 10 MG tablet Take 10 mg by mouth daily.    . cetirizine (ZYRTEC) 10 MG tablet Take 10 mg by mouth daily.    . fenofibrate 160 MG tablet Take 160 mg by mouth daily.    . fish oil-omega-3 fatty acids 1000 MG capsule Take 1 g by mouth daily.    Marland Kitchen lisinopril (PRINIVIL,ZESTRIL) 20 MG tablet Take 20 mg by mouth daily.    . metoprolol succinate (TOPROL-XL) 25 MG 24 hr tablet Take 50 mg by mouth daily.    . simvastatin (ZOCOR) 20 MG tablet Take 20 mg by mouth daily.    . pregabalin (LYRICA) 200 MG capsule Take 200 mg by mouth in the morning, at noon, and at bedtime. (Patient not taking: No sig reported)     No facility-administered medications prior to visit.    PAST MEDICAL HISTORY: Past Medical History:  Diagnosis Date  . Anxiety    no meds  . Arthritis    ankles  . Asthma   . Chronic kidney disease    hx kidney stone  . Diabetes mellitus   . Hyperlipidemia   . Hypertension   . S/P tubal ligation   . Seasonal allergies   . Trigeminal neuralgia    right    PAST SURGICAL HISTORY: Past Surgical History:  Procedure Laterality Date  . CEREBRAL MICROVASCULAR DECOMPRESSION Right 04/2020  . DENTAL SURGERY     upper bridge  . LAPAROSCOPY  07/08/2011   Procedure: LAPAROSCOPY OPERATIVE;  Surgeon: Clarene Duke, MD;  Location: [facility];  Service: Gynecology;  Laterality: N/A;  . OVARIAN CYST REMOVAL  07/08/2011   Procedure: OVARIAN CYSTECTOMY;  Surgeon: Clarene Duke, MD;  Location: [facility];  Service: Gynecology;  Laterality: N/A;  . svd     x 1  . TUBAL LIGATION      FAMILY HISTORY: Family History  Problem Relation Age of Onset  . Congestive Heart Failure Mother   . Heart attack Father   . Pulmonary embolism Father   . Diabetes Maternal Grandmother   . COPD Maternal Grandfather   . Diabetes  Paternal Grandmother   . Diabetes Paternal Grandfather     SOCIAL HISTORY: Social History   Socioeconomic History  . Marital status: Married    Spouse name: Not on file  . Number of children: 1  . Years of education: 94  . Highest education level: High school graduate  Occupational History  . Not on file  Tobacco Use  . Smoking status: Former Smoker    Packs/day: 1.00    Years: 12.00    Pack years: 12.00    Types: Cigarettes    Quit date: 1987    Years since quitting: 35.0  . Smokeless tobacco: Never Used  Vaping Use  . Vaping Use: Never used  Substance and Sexual Activity  . Alcohol use: Yes    Comment: occasional  . Drug use: No  .  Sexual activity: Yes    Birth control/protection: Surgical  Other Topics Concern  . Not on file  Social History Narrative   Lives at home with husband.   Right-handed.   Caffeine use: 2 cups per day.   Social Determinants of Health   Financial Resource Strain: Not on file  Food Insecurity: Not on file  Transportation Needs: Not on file  Physical Activity: Not on file  Stress: Not on file  Social Connections: Not on file  Intimate Partner Violence: Not on file   PHYSICAL EXAM  Vitals:   09/09/20 0753  BP: (!) 166/89  Pulse: 81  Weight: 218 lb 12.8 oz (99.2 kg)  Height: 5\' 4"  (1.626 m)   Body mass index is 37.56 kg/m.  Generalized: Well developed, in no acute distress   Neurological examination  Mentation: Alert oriented to time, place, history taking. Follows all commands speech and language fluent Cranial nerve II-XII: Pupils were equal round reactive to light. Extraocular movements were full, visual field were full on confrontational test. Facial sensation and strength were normal. Head turning and shoulder shrug  were normal and symmetric. Motor: The motor testing reveals 5 over 5 strength of all 4 extremities. Good symmetric motor tone is noted throughout.  Sensory: Sensory testing is intact to soft touch on all 4  extremities. No evidence of extinction is noted.  Coordination: Cerebellar testing reveals good finger-nose-finger and heel-to-shin bilaterally.  Gait and station: Gait is normal.  Reflexes: Deep tendon reflexes are symmetric and normal bilaterally.   DIAGNOSTIC DATA (LABS, IMAGING, TESTING) - I reviewed patient records, labs, notes, testing and imaging myself where available.  Lab Results  Component Value Date   WBC 7.3 03/05/2020   HGB 13.2 03/05/2020   HCT 39.6 03/05/2020   MCV 96 03/05/2020   PLT 232 03/05/2020      Component Value Date/Time   NA 143 03/05/2020 0808   K 4.7 03/05/2020 0808   CL 106 03/05/2020 0808   CO2 24 03/05/2020 0808   GLUCOSE 156 (H) 03/05/2020 0808   GLUCOSE 118 (H) 06/26/2011 1650   BUN 19 03/05/2020 0808   CREATININE 0.75 03/05/2020 0808   CALCIUM 9.2 03/05/2020 0808   PROT 6.8 03/05/2020 0808   ALBUMIN 4.5 03/05/2020 0808   AST 19 03/05/2020 0808   ALT 21 03/05/2020 0808   ALKPHOS 89 03/05/2020 0808   BILITOT 0.2 03/05/2020 0808   GFRNONAA 88 03/05/2020 0808   GFRAA 101 03/05/2020 0808   No results found for: CHOL, HDL, LDLCALC, LDLDIRECT, TRIG, CHOLHDL No results found for: HGBA1C Lab Results  Component Value Date   VITAMINB12 410 01/24/2020   No results found for: TSH    ASSESSMENT AND PLAN 60 y.o. year old female  has a past medical history of Anxiety, Arthritis, Asthma, Chronic kidney disease, Diabetes mellitus, Hyperlipidemia, Hypertension, S/P tubal ligation, Seasonal allergies, and Trigeminal neuralgia. here with:  1.  Right trigeminal neuralgia -MRI of the brain showed no significant structural lesion -Severe prolonged left V3 pain since January 2021 -Pain was well controlled on Lyrica 200 mg 3 times a day, Trileptal 300 mg 2 tablets twice a day, but had significant side effect -Had microvascular decompression on April 11, 2020 with Dr. Arlan Organ, last follow-up visit with Dr. Arlan Organ in Sept 2021, reported complete resolution of  facial pain, weaned off medications, had outstanding surgical outcome  2. Severe OSA on CPAP -Doing excellent on CPAP -Download reveals excellent compliance -Recommended continue CPAP nightly, greater than 4  hours -Follow-up in 1 year or sooner if needed for CPAP compliance  I spent 20 minutes of face-to-face and non-face-to-face time with patient.  This included previsit chart review, lab review, study review, order entry, electronic health record documentation, patient education.  Margie Ege, AGNP-C, DNP 09/09/2020, 8:19 AM [facility] [address] [place], [place] [address] [phone]  I reviewed the above note and documentation by the Nurse Practitioner and agree with the history, exam, assessment and plan as outlined above. I was available for consultation. Huston Foley, MD, PhD [facility])

## 2020-09-09 NOTE — Patient Instructions (Signed)
Please continue using CPAP every night Will get your CPAP download and call with results See you back in 1 year or sooner if needed

## 2020-09-11 DIAGNOSIS — G4733 Obstructive sleep apnea (adult) (pediatric): Secondary | ICD-10-CM | POA: Diagnosis not present

## 2020-09-25 DIAGNOSIS — G4733 Obstructive sleep apnea (adult) (pediatric): Secondary | ICD-10-CM | POA: Diagnosis not present

## 2020-10-23 DIAGNOSIS — G4733 Obstructive sleep apnea (adult) (pediatric): Secondary | ICD-10-CM | POA: Diagnosis not present

## 2020-10-26 DIAGNOSIS — G4733 Obstructive sleep apnea (adult) (pediatric): Secondary | ICD-10-CM | POA: Diagnosis not present

## 2020-11-23 DIAGNOSIS — G4733 Obstructive sleep apnea (adult) (pediatric): Secondary | ICD-10-CM | POA: Diagnosis not present

## 2020-12-24 DIAGNOSIS — G4733 Obstructive sleep apnea (adult) (pediatric): Secondary | ICD-10-CM | POA: Diagnosis not present

## 2021-01-09 DIAGNOSIS — Z1152 Encounter for screening for COVID-19: Secondary | ICD-10-CM | POA: Diagnosis not present

## 2021-01-23 DIAGNOSIS — G4733 Obstructive sleep apnea (adult) (pediatric): Secondary | ICD-10-CM | POA: Diagnosis not present

## 2021-02-06 DIAGNOSIS — N390 Urinary tract infection, site not specified: Secondary | ICD-10-CM | POA: Diagnosis not present

## 2021-02-19 ENCOUNTER — Other Ambulatory Visit: Payer: Self-pay | Admitting: Family Medicine

## 2021-02-19 DIAGNOSIS — Z1231 Encounter for screening mammogram for malignant neoplasm of breast: Secondary | ICD-10-CM

## 2021-02-23 DIAGNOSIS — G4733 Obstructive sleep apnea (adult) (pediatric): Secondary | ICD-10-CM | POA: Diagnosis not present

## 2021-03-25 DIAGNOSIS — G4733 Obstructive sleep apnea (adult) (pediatric): Secondary | ICD-10-CM | POA: Diagnosis not present

## 2021-04-25 DIAGNOSIS — G4733 Obstructive sleep apnea (adult) (pediatric): Secondary | ICD-10-CM | POA: Diagnosis not present

## 2021-05-01 DIAGNOSIS — E782 Mixed hyperlipidemia: Secondary | ICD-10-CM | POA: Diagnosis not present

## 2021-05-01 DIAGNOSIS — I10 Essential (primary) hypertension: Secondary | ICD-10-CM | POA: Diagnosis not present

## 2021-05-01 DIAGNOSIS — E119 Type 2 diabetes mellitus without complications: Secondary | ICD-10-CM | POA: Diagnosis not present

## 2021-05-01 DIAGNOSIS — E669 Obesity, unspecified: Secondary | ICD-10-CM | POA: Diagnosis not present

## 2021-05-06 ENCOUNTER — Other Ambulatory Visit: Payer: Self-pay | Admitting: Internal Medicine

## 2021-05-06 DIAGNOSIS — Z1231 Encounter for screening mammogram for malignant neoplasm of breast: Secondary | ICD-10-CM

## 2021-06-04 DIAGNOSIS — G4733 Obstructive sleep apnea (adult) (pediatric): Secondary | ICD-10-CM | POA: Diagnosis not present

## 2021-06-05 ENCOUNTER — Other Ambulatory Visit: Payer: Self-pay

## 2021-06-05 ENCOUNTER — Ambulatory Visit
Admission: RE | Admit: 2021-06-05 | Discharge: 2021-06-05 | Disposition: A | Discharge status: Home / Self Care | Payer: BC Managed Care – PPO | Source: Ambulatory Visit

## 2021-06-05 DIAGNOSIS — Z1231 Encounter for screening mammogram for malignant neoplasm of breast: Secondary | ICD-10-CM

## 2021-08-05 DIAGNOSIS — E782 Mixed hyperlipidemia: Secondary | ICD-10-CM | POA: Diagnosis not present

## 2021-08-05 DIAGNOSIS — Z0000 Encounter for general adult medical examination without abnormal findings: Secondary | ICD-10-CM | POA: Diagnosis not present

## 2021-08-05 DIAGNOSIS — I10 Essential (primary) hypertension: Secondary | ICD-10-CM | POA: Diagnosis not present

## 2021-08-05 DIAGNOSIS — Z23 Encounter for immunization: Secondary | ICD-10-CM | POA: Diagnosis not present

## 2021-08-05 DIAGNOSIS — E119 Type 2 diabetes mellitus without complications: Secondary | ICD-10-CM | POA: Diagnosis not present

## 2021-08-12 DIAGNOSIS — Z23 Encounter for immunization: Secondary | ICD-10-CM | POA: Diagnosis not present

## 2021-08-22 DIAGNOSIS — G4733 Obstructive sleep apnea (adult) (pediatric): Secondary | ICD-10-CM | POA: Diagnosis not present

## 2021-09-03 DIAGNOSIS — G4733 Obstructive sleep apnea (adult) (pediatric): Secondary | ICD-10-CM | POA: Diagnosis not present

## 2021-09-08 NOTE — Progress Notes (Signed)
PATIENT: Kristine Horne DOB: October 12, 1960  REASON FOR VISIT: follow up for OSA on CPAP HISTORY FROM: patient Primary Neurologist: Dr. Krista Blue for TN; Dr. Rexene Alberts for OSA  HISTORY  Kristine Horne is a 61 year old female, seen in request by her primary care physician Dr. Dewain Penning V for evaluation of facial pain, she is accompanied by her husband at today's clinical visit only 19 2021.   I have reviewed and summarized the referring note from the referring physician.  She has past medical history of hypertension, hyperlipidemia   In 2017, she had her first round of right facial pain, radiating pain from right ear to right jaw, lasting for 6 months, during that period of time, she was seen by different dentist, without clear dental etiology found, eventually she was diagnosed with trigeminal neuralgia   She began to have recurrent right facial pain again since January 2021, deep constant daily right ear achy pain, frequent radiating pain like electric burst radiating from right ear to right jaw, mainly involving right lower teeth, during intense pain, occasionally spillover to right upper teeth, worsening by talking, brushing her teeth, chewing,   She was started on gabapentin 300 mg titrating dose since March 2021, currently taking 3 tablets 3 times a day for total of 2700 mg a day, make her very sleepy but with suboptimal control of her facial pain, which is present 75% of the time, moderate degree 7 out of 10   Previously she has tried Tegretol, complains of significant GI side effect, Trileptal without significant benefit,   She works at [employer], art for [employer], was on the phone all the time, complains of difficulty due to her right facial pain.   Laboratory evaluation showed A1c of 6.4, CMP showed elevated glucose 147, normal TSH,   MRI of the brain with without contrast from [facility] on Jan 13, 2020 showed no acute abnormality, small vessel disease.  There was a vessel  adjacent to the left    UPDATE March 05 2020: Multiple phone calls since initial visit on Jan 24, 2020 due to severe prolonged left facial pain, eventually was under control with high-dose of Lyrica 200 twice a day, Trileptal 300 mg 2 tablets twice a day,   But she complains significant side effect, drowsiness, drunk feeling, sleepiness, could barely do anything else I would not go to work.   Laboratory evaluation on Jan 24, 2020, normal CBC, RPR, B12, CMP showed sodium 142  Update September 09, 2020 SS: Had microvascular decompression for TN 04/11/20 with Dr. Arlan Organ.  Had excellent surgical outcome, is pain-free, has weaned off her medications.  Had sleep evaluation with Dr Rexene Alberts was found to have severe OSA, started on CPAP.  Doing great on CPAP. "I love it".  Uses it every night, cannot sleep without it.  Has much more energy, feels more rested.  Wears full facemask. No leak of mask.  Has no reported problems.  Insurance paid 100%. Is pleased with how well she is doing. Before could barely go to work due to medication side effect, fatigue. ESS 2. Download not available at initial note, will addend note with data once available. CPAP machine, equipment is working perfect.   Addendum 09/11/19 SS: Review of CPAP data from 08/11/20-09/09/20 reveals excellent compliance, greater than 4 hours 30/30 days at 100%.  Average usage 9 hours and 4 minutes.  Minimum pressure 6 cm water, maximum pressure 12 cm water.  AHI 0.6, leak in the 95th percentile  9.2.  Update September 09, 2021 SS: Review of Download below shows excellent compliance, AHI is well treated at 0.4. Uses full face mask. Continues to have huge benefit from CPAP, feels much more rested, "couldn't go without it". ESS 1. Continued complete resolution of right TN pain. Working with PCP to get management of glucose, on Ozempic.    REVIEW OF SYSTEMS: Out of a complete 14 system review of symptoms, the patient complains only of the following symptoms, and all  other reviewed systems are negative.  See HPI  ALLERGIES: Allergies  Allergen Reactions   Erythromycin Nausea And Vomiting   Tetracyclines & Related Hives   Ibuprofen [Ibuprofen] Swelling   Penicillins Swelling   Sulfa Antibiotics Hives   Tegretol [Carbamazepine] Nausea And Vomiting    HOME MEDICATIONS: Outpatient Medications Prior to Visit  Medication Sig Dispense Refill   acetaminophen (TYLENOL) 500 MG tablet Take 1,000 mg by mouth every 6 (six) hours as needed. Patient takes for pain     amLODipine (NORVASC) 10 MG tablet Take 10 mg by mouth daily.     cetirizine (ZYRTEC) 10 MG tablet Take 10 mg by mouth daily.     fenofibrate 160 MG tablet Take 160 mg by mouth daily.     fish oil-omega-3 fatty acids 1000 MG capsule Take 1 g by mouth daily.     JANUVIA 100 MG tablet Take 100 mg by mouth daily.     lisinopril (PRINIVIL,ZESTRIL) 20 MG tablet Take 20 mg by mouth daily.     metoprolol succinate (TOPROL-XL) 25 MG 24 hr tablet Take 50 mg by mouth daily.     OZEMPIC, 0.25 OR 0.5 MG/DOSE, 2 MG/1.5ML SOPN SMARTSIG:0.25 Milligram(s) SUB-Q Once a Week     simvastatin (ZOCOR) 20 MG tablet Take 20 mg by mouth daily.     pregabalin (LYRICA) 200 MG capsule Take 200 mg by mouth in the morning, at noon, and at bedtime. (Patient not taking: No sig reported)     No facility-administered medications prior to visit.    PAST MEDICAL HISTORY: Past Medical History:  Diagnosis Date   Anxiety    no meds   Arthritis    ankles   Asthma    Chronic kidney disease    hx kidney stone   Diabetes mellitus    Hyperlipidemia    Hypertension    S/P tubal ligation    Seasonal allergies    Trigeminal neuralgia    right    PAST SURGICAL HISTORY: Past Surgical History:  Procedure Laterality Date   CEREBRAL MICROVASCULAR DECOMPRESSION Right 04/2020   DENTAL SURGERY     upper bridge   LAPAROSCOPY  07/08/2011   Procedure: LAPAROSCOPY OPERATIVE;  Surgeon: Clarene Duke, MD;  Location: [facility];   Service: Gynecology;  Laterality: N/A;   OVARIAN CYST REMOVAL  07/08/2011   Procedure: OVARIAN CYSTECTOMY;  Surgeon: Clarene Duke, MD;  Location: [facility];  Service: Gynecology;  Laterality: N/A;   svd     x 1   TUBAL LIGATION      FAMILY HISTORY: Family History  Problem Relation Age of Onset   Congestive Heart Failure Mother    Heart attack Father    Pulmonary embolism Father    Diabetes Maternal Grandmother    COPD Maternal Grandfather    Diabetes Paternal Grandmother    Diabetes Paternal Grandfather     SOCIAL HISTORY: Social History   Socioeconomic History   Marital status: Married    Spouse name: Not on  file   Number of children: 1   Years of education: 12   Highest education level: High school graduate  Occupational History   Not on file  Tobacco Use   Smoking status: Former    Packs/day: 1.00    Years: 12.00    Pack years: 12.00    Types: Cigarettes    Quit date: 23    Years since quitting: 36.0   Smokeless tobacco: Never  Vaping Use   Vaping Use: Never used  Substance and Sexual Activity   Alcohol use: Yes    Comment: occasional   Drug use: No   Sexual activity: Yes    Birth control/protection: Surgical  Other Topics Concern   Not on file  Social History Narrative   Lives at home with husband.   Right-handed.   Caffeine use: 2 cups per day.   Social Determinants of Health   Financial Resource Strain: Not on file  Food Insecurity: Not on file  Transportation Needs: Not on file  Physical Activity: Not on file  Stress: Not on file  Social Connections: Not on file  Intimate Partner Violence: Not on file   PHYSICAL EXAM  Vitals:   09/09/21 0739  BP: 132/72  Pulse: 74  Weight: 199 lb (90.3 kg)  Height: 5\' 4"  (1.626 m)    Body mass index is 34.16 kg/m.  Generalized: Well developed, in no acute distress   Neurological examination  Mentation: Alert oriented to time, place, history taking. Follows all commands speech and language  fluent Cranial nerve II-XII: Pupils were equal round reactive to light. Extraocular movements were full, visual field were full on confrontational test. Facial sensation and strength were normal. Head turning and shoulder shrug  were normal and symmetric. Motor: The motor testing reveals 5 over 5 strength of all 4 extremities. Good symmetric motor tone is noted throughout.  Sensory: Sensory testing is intact to soft touch on all 4 extremities. No evidence of extinction is noted.  Coordination: Cerebellar testing reveals good finger-nose-finger and heel-to-shin bilaterally.  Gait and station: Gait is normal.  Reflexes: Deep tendon reflexes are symmetric and normal bilaterally.   DIAGNOSTIC DATA (LABS, IMAGING, TESTING) - I reviewed patient records, labs, notes, testing and imaging myself where available.  Lab Results  Component Value Date   WBC 7.3 03/05/2020   HGB 13.2 03/05/2020   HCT 39.6 03/05/2020   MCV 96 03/05/2020   PLT 232 03/05/2020      Component Value Date/Time   NA 143 03/05/2020 0808   K 4.7 03/05/2020 0808   CL 106 03/05/2020 0808   CO2 24 03/05/2020 0808   GLUCOSE 156 (H) 03/05/2020 0808   GLUCOSE 118 (H) 06/26/2011 1650   BUN 19 03/05/2020 0808   CREATININE 0.75 03/05/2020 0808   CALCIUM 9.2 03/05/2020 0808   PROT 6.8 03/05/2020 0808   ALBUMIN 4.5 03/05/2020 0808   AST 19 03/05/2020 0808   ALT 21 03/05/2020 0808   ALKPHOS 89 03/05/2020 0808   BILITOT 0.2 03/05/2020 0808   GFRNONAA 88 03/05/2020 0808   GFRAA 101 03/05/2020 0808   No results found for: CHOL, HDL, LDLCALC, LDLDIRECT, TRIG, CHOLHDL No results found for: HGBA1C Lab Results  Component Value Date   VITAMINB12 410 01/24/2020   No results found for: TSH  ASSESSMENT AND PLAN 61 y.o. year old female  has a past medical history of Anxiety, Arthritis, Asthma, Chronic kidney disease, Diabetes mellitus, Hyperlipidemia, Hypertension, S/P tubal ligation, Seasonal allergies, and Trigeminal neuralgia.  here with:  1.  Right trigeminal neuralgia -Complete resolution of pain post-microvascular decompression August 2021 with Dr. Arlan Organ, has had outstanding outcome -MRI of the brain showed no significant structural lesion -Severe prolonged left V3 pain since January 2021 -Pain was well controlled on Lyrica 200 mg 3 times a day, Trileptal 300 mg 2 tablets twice a day, but had significant side effect  2. Severe OSA on CPAP -Download shows excellent compliance  -Recommended continue CPAP nightly, greater than 4 hours -DME order sent for supplies -Follow-up in 1 year or sooner if needed for CPAP compliance, okay for 15 min VV  Butler Denmark, AGNP-C, DNP 09/09/2021, 7:59 AM [facility] [address], [place], [place] [address] [phone]

## 2021-09-09 ENCOUNTER — Encounter: Payer: Self-pay | Admitting: Neurology

## 2021-09-09 ENCOUNTER — Ambulatory Visit: Payer: BC Managed Care – PPO | Admitting: Neurology

## 2021-09-09 VITALS — BP 132/72 | HR 74 | Ht 64.0 in | Wt 199.0 lb

## 2021-09-09 DIAGNOSIS — Z9989 Dependence on other enabling machines and devices: Secondary | ICD-10-CM | POA: Diagnosis not present

## 2021-09-09 DIAGNOSIS — G4733 Obstructive sleep apnea (adult) (pediatric): Secondary | ICD-10-CM

## 2021-09-09 NOTE — Progress Notes (Signed)
Orders faxed to DME

## 2021-09-09 NOTE — Patient Instructions (Signed)
Great to see you today! Continue CPAP use nightly  See you back in 1 year

## 2021-09-23 IMAGING — MG DIGITAL SCREENING BILAT W/ TOMO W/ CAD
8 series · 8 of 24 positions shown · non-contrast
Comparison: Previous exam(s).

CLINICAL DATA: Screening.

EXAM:
DIGITAL SCREENING BILATERAL MAMMOGRAM WITH TOMO AND CAD

[R MLO synth-2D]
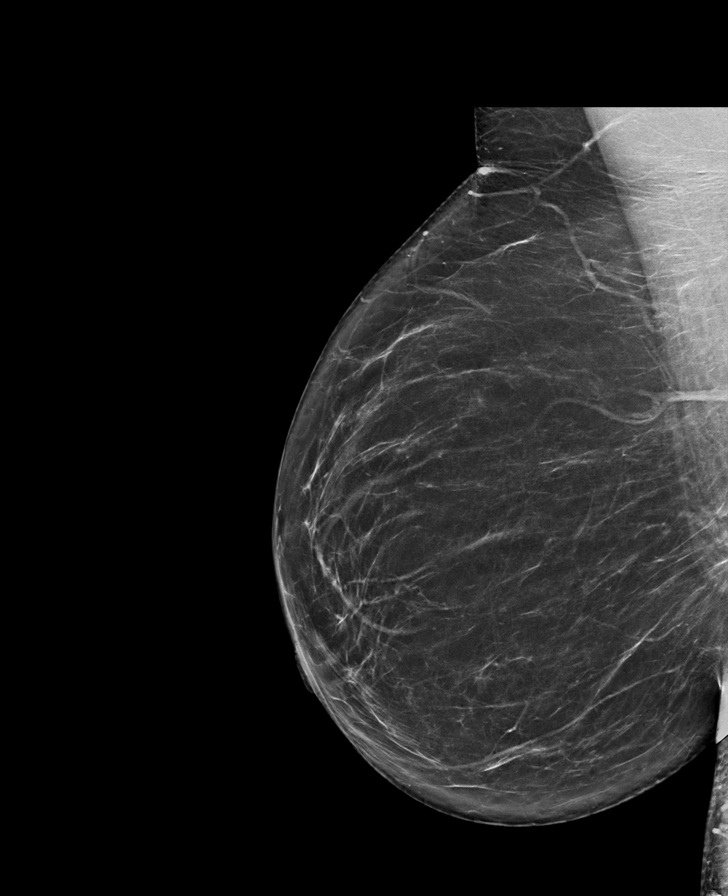

[L CC synth-2D]
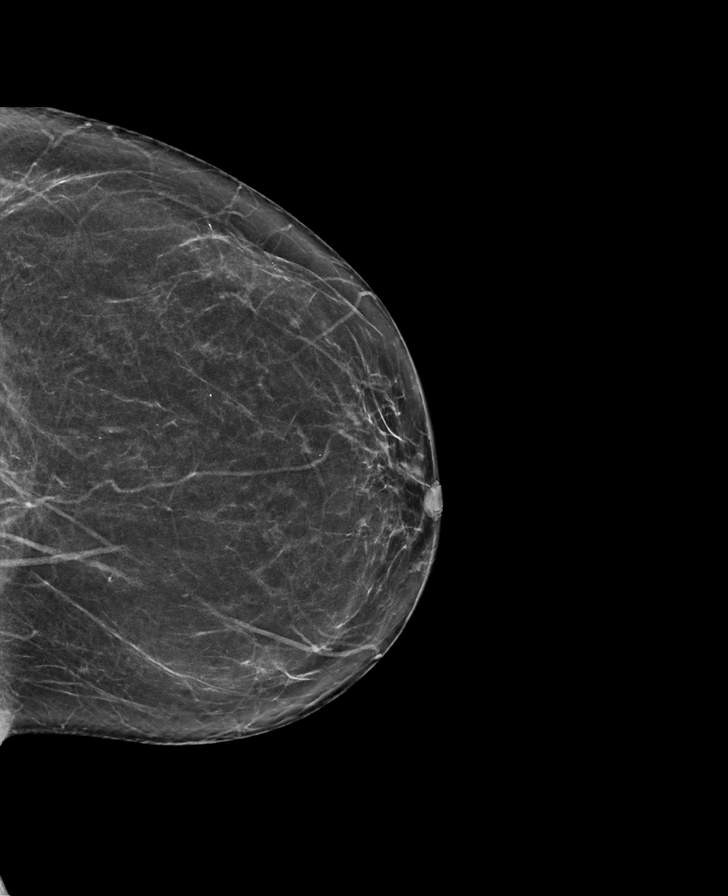

[L MLO synth-2D]
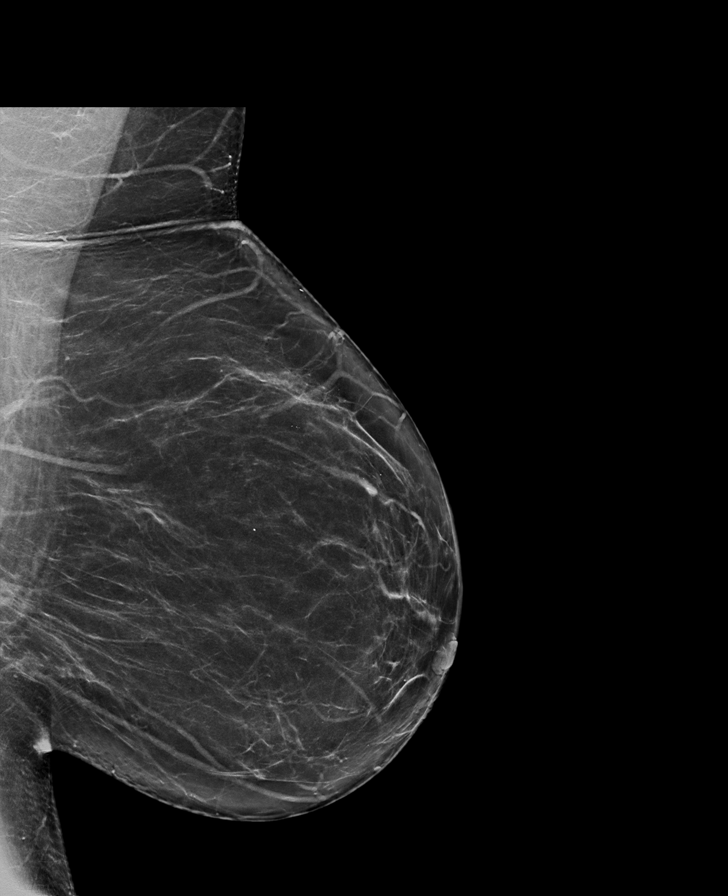

[R CC synth-2D]
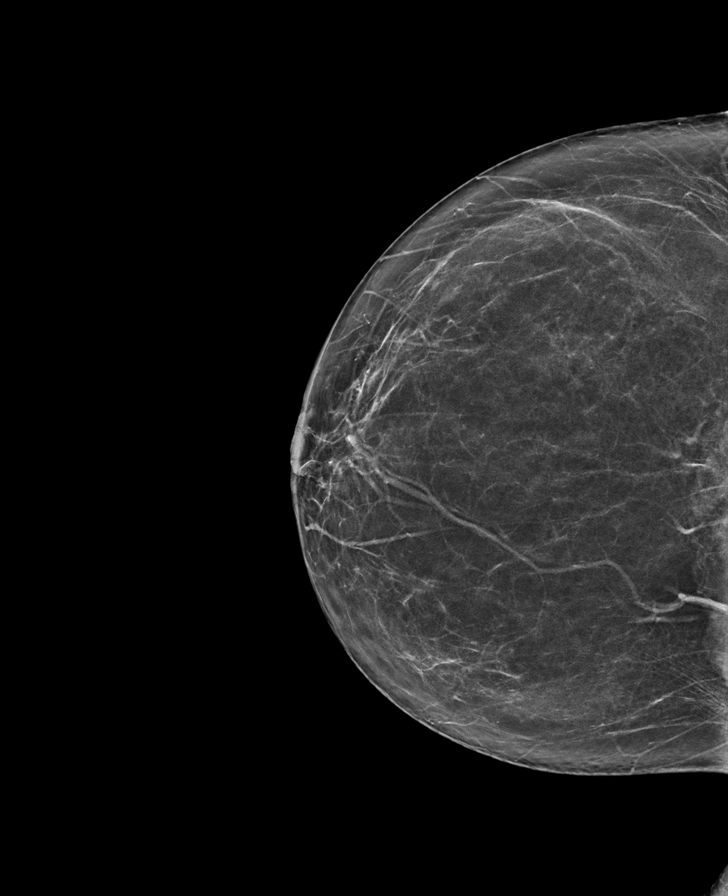

[L MLO tomo · tomo slice 39/77.0]
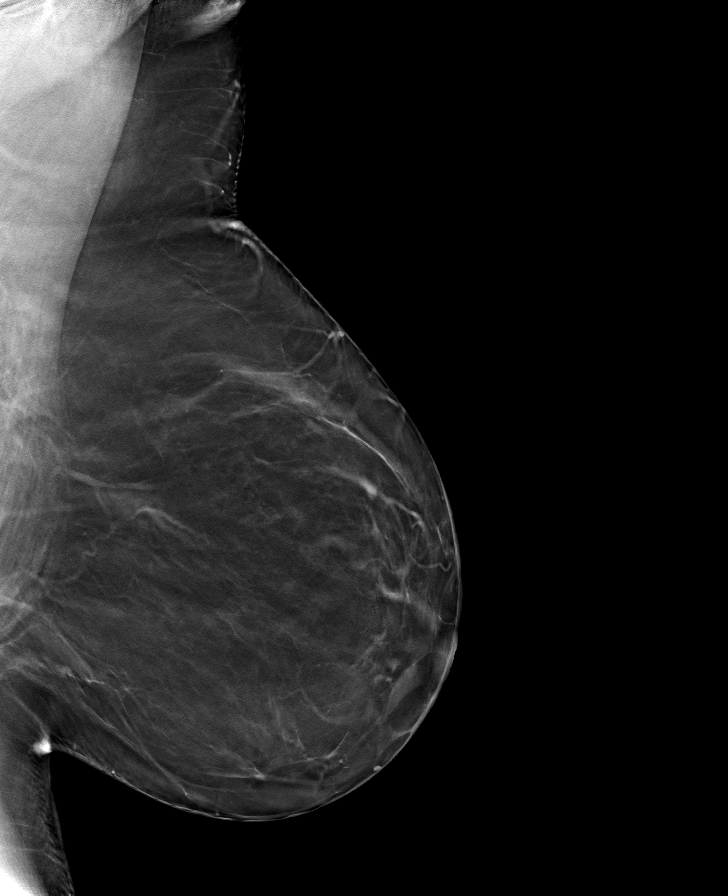

[R CC tomo · tomo slice 35/68.0]
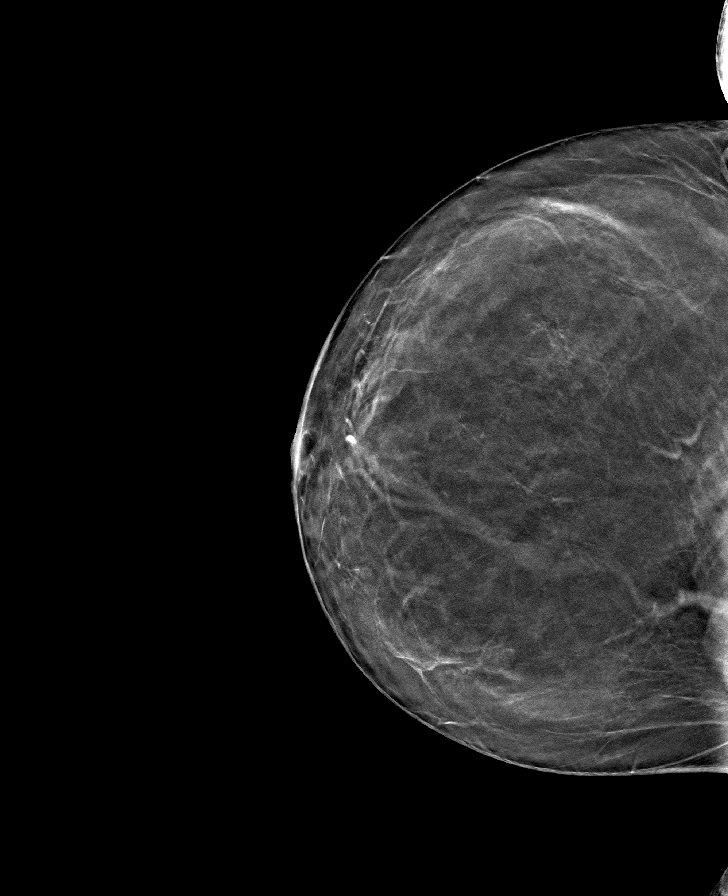

[R MLO tomo · tomo slice 40/79.0]
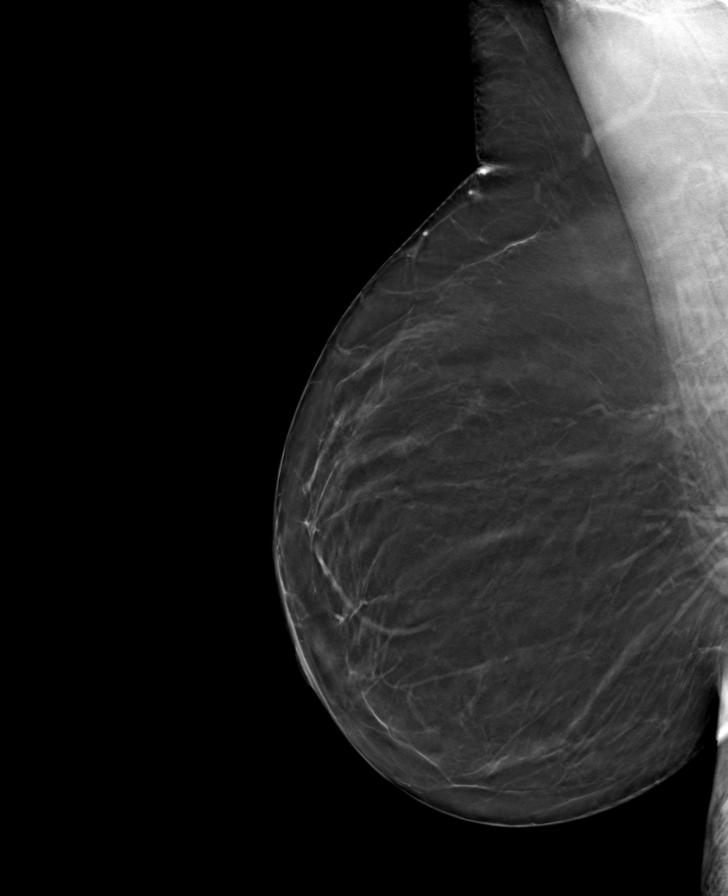

[L CC tomo · tomo slice 34/67.0]
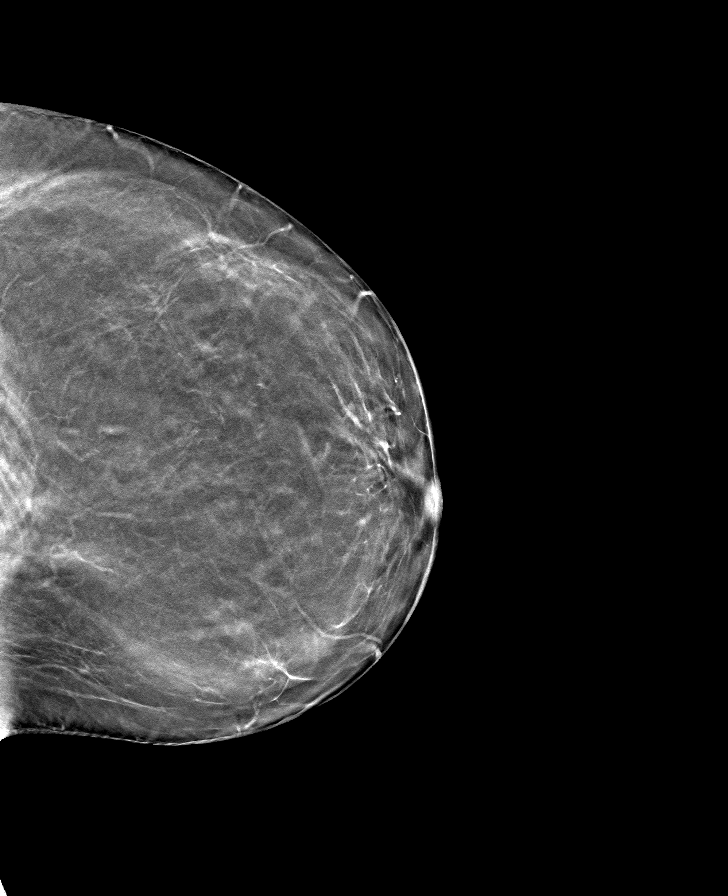

[8 of 24 positions shown; findings below may reference images not displayed]

ACR Breast Density Category b: There are scattered areas of
fibroglandular density.
FINDINGS: There are no findings suspicious for malignancy. Images were
processed with CAD.
IMPRESSION: No mammographic evidence of malignancy. A result letter of this
screening mammogram will be mailed directly to the patient.

RECOMMENDATION:
Screening mammogram in one year. (Code:[id number])

BI-RADS CATEGORY  1: Negative.

## 2022-02-04 DIAGNOSIS — E782 Mixed hyperlipidemia: Secondary | ICD-10-CM | POA: Diagnosis not present

## 2022-02-04 DIAGNOSIS — I10 Essential (primary) hypertension: Secondary | ICD-10-CM | POA: Diagnosis not present

## 2022-02-04 DIAGNOSIS — Z23 Encounter for immunization: Secondary | ICD-10-CM | POA: Diagnosis not present

## 2022-02-04 DIAGNOSIS — G4733 Obstructive sleep apnea (adult) (pediatric): Secondary | ICD-10-CM | POA: Diagnosis not present

## 2022-02-04 DIAGNOSIS — E119 Type 2 diabetes mellitus without complications: Secondary | ICD-10-CM | POA: Diagnosis not present

## 2022-03-18 DIAGNOSIS — G4733 Obstructive sleep apnea (adult) (pediatric): Secondary | ICD-10-CM | POA: Diagnosis not present

## 2022-05-27 ENCOUNTER — Other Ambulatory Visit: Payer: Self-pay | Admitting: Internal Medicine

## 2022-05-27 DIAGNOSIS — Z1231 Encounter for screening mammogram for malignant neoplasm of breast: Secondary | ICD-10-CM

## 2022-08-07 ENCOUNTER — Ambulatory Visit
Admission: RE | Admit: 2022-08-07 | Discharge: 2022-08-07 | Disposition: A | Discharge status: Home / Self Care | Payer: BC Managed Care – PPO | Source: Ambulatory Visit | Attending: Internal Medicine | Admitting: Internal Medicine

## 2022-08-07 DIAGNOSIS — E119 Type 2 diabetes mellitus without complications: Secondary | ICD-10-CM | POA: Diagnosis not present

## 2022-08-07 DIAGNOSIS — G4733 Obstructive sleep apnea (adult) (pediatric): Secondary | ICD-10-CM | POA: Diagnosis not present

## 2022-08-07 DIAGNOSIS — Z23 Encounter for immunization: Secondary | ICD-10-CM | POA: Diagnosis not present

## 2022-08-07 DIAGNOSIS — Z0000 Encounter for general adult medical examination without abnormal findings: Secondary | ICD-10-CM | POA: Diagnosis not present

## 2022-08-07 DIAGNOSIS — E782 Mixed hyperlipidemia: Secondary | ICD-10-CM | POA: Diagnosis not present

## 2022-08-07 DIAGNOSIS — I10 Essential (primary) hypertension: Secondary | ICD-10-CM | POA: Diagnosis not present

## 2022-08-07 DIAGNOSIS — Z1231 Encounter for screening mammogram for malignant neoplasm of breast: Secondary | ICD-10-CM

## 2022-09-09 ENCOUNTER — Telehealth (INDEPENDENT_AMBULATORY_CARE_PROVIDER_SITE_OTHER): Payer: BC Managed Care – PPO | Admitting: Neurology

## 2022-09-09 DIAGNOSIS — G4733 Obstructive sleep apnea (adult) (pediatric): Secondary | ICD-10-CM

## 2022-09-09 NOTE — Progress Notes (Signed)
   Virtual Visit via Video Note  I connected with Kristine Horne on 09/09/22 at  4:00 PM EST by a video enabled telemedicine application and verified that I am speaking with the correct person using two identifiers.  Location: Patient: at her home Provider: in the office    I discussed the limitations of evaluation and management by telemedicine and the availability of in person appointments. The patient expressed understanding and agreed to proceed.  History of Present Illness: Update September 09, 2021 SS: Review of Download below shows excellent compliance, AHI is well treated at 0.4. Uses full face mask. Continues to have huge benefit from CPAP, feels much more rested, "couldn't go without it". ESS 1. Continued complete resolution of right TN pain. Working with PCP to get management of glucose, on Ozempic.   Update September 09, 2022 SS: Here today for CPAP follow-up.  Data below shows excellent compliance at 100%.  AHI 0.1. Feels CPAP is doing great, sleeps very well, doesn't sleep without it. Has much more energy. Complete resolution of TN pain. No questions or concerns. Uses full face mask. Does report a high deductible for her supplies. Machine is working well.       Observations/Objective: Via virtual visit, is alert and oriented, speech is clear and concise, facial symmetry noted, moves about freely  Assessment and Plan: 1.  Severe OSA on CPAP -Continues to demonstrate superb compliance -Recommended continue nightly use greater than 4 hours -Send order to DME for continued supplies as needed  Follow Up Instructions: 1 year   I discussed the assessment and treatment plan with the patient. The patient was provided an opportunity to ask questions and all were answered. The patient agreed with the plan and demonstrated an understanding of the instructions.   The patient was advised to call back or seek an in-person evaluation if the symptoms worsen or if the condition fails to  improve as anticipated.  Evangeline Dakin, DNP  [facility] [address], [place], [place] [address] [phone]

## 2022-09-30 DIAGNOSIS — G4733 Obstructive sleep apnea (adult) (pediatric): Secondary | ICD-10-CM | POA: Diagnosis not present

## 2022-10-02 DIAGNOSIS — Z01818 Encounter for other preprocedural examination: Secondary | ICD-10-CM | POA: Diagnosis not present

## 2022-10-02 DIAGNOSIS — Z86010 Personal history of colonic polyps: Secondary | ICD-10-CM | POA: Diagnosis not present

## 2022-11-14 DIAGNOSIS — J019 Acute sinusitis, unspecified: Secondary | ICD-10-CM | POA: Diagnosis not present

## 2023-01-04 DIAGNOSIS — K648 Other hemorrhoids: Secondary | ICD-10-CM | POA: Diagnosis not present

## 2023-01-04 DIAGNOSIS — K635 Polyp of colon: Secondary | ICD-10-CM | POA: Diagnosis not present

## 2023-01-04 DIAGNOSIS — Z09 Encounter for follow-up examination after completed treatment for conditions other than malignant neoplasm: Secondary | ICD-10-CM | POA: Diagnosis not present

## 2023-01-04 DIAGNOSIS — K621 Rectal polyp: Secondary | ICD-10-CM | POA: Diagnosis not present

## 2023-01-04 DIAGNOSIS — Z86010 Personal history of colonic polyps: Secondary | ICD-10-CM | POA: Diagnosis not present

## 2023-01-04 DIAGNOSIS — K5730 Diverticulosis of large intestine without perforation or abscess without bleeding: Secondary | ICD-10-CM | POA: Diagnosis not present

## 2023-02-10 DIAGNOSIS — E782 Mixed hyperlipidemia: Secondary | ICD-10-CM | POA: Diagnosis not present

## 2023-02-10 DIAGNOSIS — G4733 Obstructive sleep apnea (adult) (pediatric): Secondary | ICD-10-CM | POA: Diagnosis not present

## 2023-02-10 DIAGNOSIS — I10 Essential (primary) hypertension: Secondary | ICD-10-CM | POA: Diagnosis not present

## 2023-02-10 DIAGNOSIS — E119 Type 2 diabetes mellitus without complications: Secondary | ICD-10-CM | POA: Diagnosis not present

## 2023-04-27 DIAGNOSIS — G4733 Obstructive sleep apnea (adult) (pediatric): Secondary | ICD-10-CM | POA: Diagnosis not present

## 2023-05-24 DIAGNOSIS — E119 Type 2 diabetes mellitus without complications: Secondary | ICD-10-CM | POA: Diagnosis not present

## 2023-08-17 ENCOUNTER — Other Ambulatory Visit: Payer: Self-pay | Admitting: Internal Medicine

## 2023-08-17 DIAGNOSIS — Z1231 Encounter for screening mammogram for malignant neoplasm of breast: Secondary | ICD-10-CM

## 2023-08-27 DIAGNOSIS — Z0000 Encounter for general adult medical examination without abnormal findings: Secondary | ICD-10-CM | POA: Diagnosis not present

## 2023-08-27 DIAGNOSIS — Z23 Encounter for immunization: Secondary | ICD-10-CM | POA: Diagnosis not present

## 2023-08-27 DIAGNOSIS — I10 Essential (primary) hypertension: Secondary | ICD-10-CM | POA: Diagnosis not present

## 2023-08-27 DIAGNOSIS — E1165 Type 2 diabetes mellitus with hyperglycemia: Secondary | ICD-10-CM | POA: Diagnosis not present

## 2023-09-14 NOTE — Progress Notes (Deleted)
   Virtual Visit via Video Note  I connected with Kristine Horne on 09/14/23 at  4:00 PM EST by a video enabled telemedicine application and verified that I am speaking with the correct person using two identifiers.  Location: Patient: at her home Provider: in the office    I discussed the limitations of evaluation and management by telemedicine and the availability of in person appointments. The patient expressed understanding and agreed to proceed.  History of Present Illness: Update September 15, 2023 SS:   Update September 09, 2021 SS: Review of Download below shows excellent compliance, AHI is well treated at 0.4. Uses full face mask. Continues to have huge benefit from CPAP, feels much more rested, couldn't go without it. ESS 1. Continued complete resolution of right TN pain. Working with PCP to get management of glucose, on Ozempic.   Update September 09, 2022 SS: Here today for CPAP follow-up.  Data below shows excellent compliance at 100%.  AHI 0.1. Feels CPAP is doing great, sleeps very well, doesn't sleep without it. Has much more energy. Complete resolution of TN pain. No questions or concerns. Uses full face mask. Does report a high deductible for her supplies. Machine is working well.     Observations/Objective: Via virtual visit, is alert and oriented, speech is clear and concise, facial symmetry noted, moves about freely  Assessment and Plan: 1.  Severe OSA on CPAP -Continues to demonstrate superb compliance -Recommended continue nightly use greater than 4 hours -Send order to DME for continued supplies as needed  Follow Up Instructions: 1 year   I discussed the assessment and treatment plan with the patient. The patient was provided an opportunity to ask questions and all were answered. The patient agreed with the plan and demonstrated an understanding of the instructions.   The patient was advised to call back or seek an in-person evaluation if the symptoms worsen or if  the condition fails to improve as anticipated.  Lauraine Gayland MANDES, DNP  [facility] [address], [address] [place], [place] [address] [phone]

## 2023-09-15 ENCOUNTER — Telehealth (INDEPENDENT_AMBULATORY_CARE_PROVIDER_SITE_OTHER): Payer: No Typology Code available for payment source | Admitting: Neurology

## 2023-09-15 ENCOUNTER — Telehealth: Payer: No Typology Code available for payment source | Admitting: Neurology

## 2023-09-15 ENCOUNTER — Ambulatory Visit
Admission: RE | Admit: 2023-09-15 | Discharge: 2023-09-15 | Disposition: A | Discharge status: Home / Self Care | Payer: BC Managed Care – PPO | Source: Ambulatory Visit | Attending: Internal Medicine | Admitting: Internal Medicine

## 2023-09-15 ENCOUNTER — Ambulatory Visit: Payer: BC Managed Care – PPO

## 2023-09-15 DIAGNOSIS — G4733 Obstructive sleep apnea (adult) (pediatric): Secondary | ICD-10-CM | POA: Diagnosis not present

## 2023-09-15 DIAGNOSIS — Z1231 Encounter for screening mammogram for malignant neoplasm of breast: Secondary | ICD-10-CM

## 2023-09-15 NOTE — Patient Instructions (Signed)
 Great to see you today! Continue with excellent CPAP use! We will continue current settings.  Continue to use nightly for minimum of 4 hours! Follow-up in 1 year or sooner if needed.  Thanks!!

## 2023-09-15 NOTE — Progress Notes (Signed)
   Virtual Visit via Video Note  I connected with Kristine Horne on 09/15/23 at  7:45 AM EST by a video enabled telemedicine application and verified that I am speaking with the correct person using two identifiers.  Location: Patient: at her home Provider: in the office    I discussed the limitations of evaluation and management by telemedicine and the availability of in person appointments. The patient expressed understanding and agreed to proceed.  History of Present Illness: Update September 15, 2023 SS: CPAP download 08/15/2023-09/13/23 shows superb compliance at 100%.  9 hours 6 minutes. 6-12 cm.  Leak 3.1, AHI 0.3. has no issues with CPAP. Uses it all the time. Uses FFM. No health issues. A1C doing better is around 7. Has lost 20 lbs in the last year. No concerns today.   Update September 09, 2021 SS: Review of Download below shows excellent compliance, AHI is well treated at 0.4. Uses full face mask. Continues to have huge benefit from CPAP, feels much more rested, couldn't go without it. ESS 1. Continued complete resolution of right TN pain. Working with PCP to get management of glucose, on Ozempic.   Update September 09, 2022 SS: Here today for CPAP follow-up.  Data below shows excellent compliance at 100%.  AHI 0.1. Feels CPAP is doing great, sleeps very well, doesn't sleep without it. Has much more energy. Complete resolution of TN pain. No questions or concerns. Uses full face mask. Does report a high deductible for her supplies. Machine is working well.       Observations/Objective: Via virtual visit, is alert and oriented, speech is clear and concise, facial symmetry noted, moves about freely  Assessment and Plan: 1.  Severe OSA on CPAP (HST October 2021 showing severe OSA AHI 30.4/hour) -CPAP usage is great, continues to have super benefit from CPAP. Will continue current settings, will continue to get supplies from DME.  Encouraged to continue nightly usage for minimum of 4  hours -Set up date was October 2021, she may be eligible for a new machine around October 2026  Follow Up Instructions: 1 year my chart video visit    I discussed the assessment and treatment plan with the patient. The patient was provided an opportunity to ask questions and all were answered. The patient agreed with the plan and demonstrated an understanding of the instructions.   The patient was advised to call back or seek an in-person evaluation if the symptoms worsen or if the condition fails to improve as anticipated.  Lauraine Gayland MANDES, DNP  [facility] [address], [address] [place], [place] [address] [phone]

## 2023-12-15 DIAGNOSIS — E785 Hyperlipidemia, unspecified: Secondary | ICD-10-CM | POA: Insufficient documentation

## 2023-12-15 DIAGNOSIS — F419 Anxiety disorder, unspecified: Secondary | ICD-10-CM | POA: Insufficient documentation

## 2023-12-15 DIAGNOSIS — Z9851 Tubal ligation status: Secondary | ICD-10-CM | POA: Insufficient documentation

## 2023-12-15 DIAGNOSIS — I10 Essential (primary) hypertension: Secondary | ICD-10-CM | POA: Insufficient documentation

## 2023-12-15 DIAGNOSIS — N189 Chronic kidney disease, unspecified: Secondary | ICD-10-CM | POA: Insufficient documentation

## 2023-12-15 DIAGNOSIS — J302 Other seasonal allergic rhinitis: Secondary | ICD-10-CM | POA: Insufficient documentation

## 2023-12-15 DIAGNOSIS — E119 Type 2 diabetes mellitus without complications: Secondary | ICD-10-CM

## 2023-12-15 DIAGNOSIS — M1990 Unspecified osteoarthritis, unspecified site: Secondary | ICD-10-CM | POA: Insufficient documentation

## 2023-12-15 HISTORY — DX: Type 2 diabetes mellitus without complications: E11.9

## 2023-12-16 ENCOUNTER — Ambulatory Visit

## 2023-12-16 VITALS — BP 124/72 | HR 72 | Ht 64.0 in | Wt 193.8 lb

## 2023-12-16 DIAGNOSIS — I10 Essential (primary) hypertension: Secondary | ICD-10-CM

## 2023-12-16 DIAGNOSIS — E782 Mixed hyperlipidemia: Secondary | ICD-10-CM

## 2023-12-16 DIAGNOSIS — I63541 Cerebral infarction due to unspecified occlusion or stenosis of right cerebellar artery: Secondary | ICD-10-CM

## 2023-12-16 DIAGNOSIS — F419 Anxiety disorder, unspecified: Secondary | ICD-10-CM

## 2023-12-16 DIAGNOSIS — Z9851 Tubal ligation status: Secondary | ICD-10-CM

## 2023-12-16 DIAGNOSIS — J302 Other seasonal allergic rhinitis: Secondary | ICD-10-CM

## 2023-12-16 DIAGNOSIS — I639 Cerebral infarction, unspecified: Secondary | ICD-10-CM

## 2023-12-16 DIAGNOSIS — M1990 Unspecified osteoarthritis, unspecified site: Secondary | ICD-10-CM

## 2023-12-16 HISTORY — DX: Cerebral infarction, unspecified: I63.9

## 2023-12-16 MED ORDER — ATORVASTATIN CALCIUM 80 MG PO TABS: 80.0000 mg | ORAL_TABLET | Freq: Every day | ORAL | 3 refills | Status: AC

## 2023-12-16 NOTE — Progress Notes (Signed)
 Cardiology Consultation:    Date:  12/16/2023   ID:  Kristine Horne, DOB Apr 23, 1961, MRN [id number]  PCP:  Ollen Bowl, MD  Cardiologist:  Luretha Murphy, MD   Referring MD: Ollen Bowl, MD   No chief complaint on file.    ASSESSMENT AND PLAN:   Ms. Kristine Horne 63 year old woman with history of multifocal posterior circulatory stroke 11/12/2023, with near complete resolution of symptoms, diabetes mellitus, hypertension, hyperlipidemia here for further evaluation from cardiac standpoint for workup in the setting of stroke.  Problem List Items Addressed This Visit     Hyperlipidemia   Was previously on simvastatin at home.  Later switched to atorvastatin 40 mg once a day prior to discharge from the hospital which she has been taking consistently.  Titrate up the dose of atorvastatin to 80 mg once daily. Repeat CMP tentatively in 3 to 4 weeks to monitor liver function test.  Target LDL less than 70 mg/dL.       Relevant Medications   atorvastatin (LIPITOR) 80 MG tablet   Hypertension   Well-controlled on current regimen with amlodipine 10 mg once daily, lisinopril 20 mg once daily.       Relevant Medications   atorvastatin (LIPITOR) 80 MG tablet   Stroke [facility]) - Primary   Stroke 11-12-2023 involving superior cerebellar peduncle on the right. Symptoms complete resolution and functional status back to baseline.  Here for cardiac evaluation.  Will schedule her for a transthoracic echocardiogram with agitated saline study to assess for any intracardiac shunt. Will schedule for 28-day heart monitor to assess for any underlying A-fib.   Currently on Plavix 75 mg once daily. Has pending follow-up with neurologist.  Also obtain ultrasound carotids      Relevant Medications   atorvastatin (LIPITOR) 80 MG tablet   Other Relevant Orders   EKG 12-Lead (Completed)   LONG TERM MONITOR (3-14 DAYS)   Comprehensive metabolic panel with GFR   ECHOCARDIOGRAM COMPLETE    VAS US CAROTID    Return to clinic based on test results.  History of Present Illness:    Kristine Horne is a 63 y.o. female who is being seen today for the evaluation of cardiac sources of embolization in the setting of recent stroke at the request of Pahwani, Kasandra Knudsen, MD.  She had multifocal posterior circulation stroke recently and was treated at [facility] March 2025, diabetes mellitus type 2, hypertension, hyperlipidemia, right side trigeminal neuralgia s/p surgical intervention in 2021. No prior history of CAD, CHF, MI.  Here for the visit accompanied by her husband.  She works for an [employer], community and her job involves working from home and relatively sedentary.  But she kept herself busy with household activities and outdoor walking on a regular basis up to an hour.  Last month she was having incoordination symptoms of right side and overnight symptoms got worse and was taken to [facility] ER where she was noted to have multifocal posterior circulation ischemic stroke.  CT of the head and MRI were completed and confirmed acute infarct involving the superior cerebellar peduncle on the right.  Mild stenosis of proximal internal carotid artery on the right was noted.  Her symptoms started improving and she was discharged home the next day.  Initially she used a walker for ambulating but she is back to her near baseline in terms of functional status.  Denies any chest pain, shortness of breath, orthopnea or paroxysmal nocturnal  dyspnea. Denies any palpitations. Denies any blood in urine or stools.  She is allergic to aspirin hence remains on Plavix 75 mg once daily. Her lipid therapy was switched from simvastatin to atorvastatin during the hospital stay.  She is pending visit with neurologist on April 21.  EKG in the clinic today shows sinus rhythm heart rate 72/min, PR interval 158 ms, QRS duration 74 ms, QTc 444 ms.   Past Medical  History:  Diagnosis Date   Anxiety    no meds   Arthritis    ankles   Asthma    Asthma 12/12/2011   Chronic kidney disease    hx kidney stone   Class 2 severe obesity due to excess calories with serious comorbidity and body mass index (BMI) of 35.0 to 35.9 in adult (HCC) 07/01/2017   Diabetes mellitus    Diabetes mellitus (HCC) 12/15/2023   Type 2     DM type 2 with diabetic mixed hyperlipidemia (HCC) 04/02/2016   History of adenomatous polyp of colon 01/27/2017   H/o adenomas 01/27/17; repeat 01/2022; Barbaraann Share, MD [facility])     Hyperlipidemia    Hypertension    Obesity (BMI 30-39.9) 07/03/2016   OSA on CPAP 09/09/2020   Positive colorectal cancer screening using DNA-based stool test 12/01/2016   S/P tubal ligation    Seasonal allergies    TMJ (dislocation of temporomandibular joint) 09/10/2015   Trigeminal neuralgia    right    Past Surgical History:  Procedure Laterality Date   CEREBRAL MICROVASCULAR DECOMPRESSION Right 04/2020   DENTAL SURGERY     upper bridge   LAPAROSCOPY  07/08/2011   Procedure: LAPAROSCOPY OPERATIVE;  Surgeon: Zenaida Niece, MD;  Location: [facility];  Service: Gynecology;  Laterality: N/A;   OVARIAN CYST REMOVAL  07/08/2011   Procedure: OVARIAN CYSTECTOMY;  Surgeon: Zenaida Niece, MD;  Location: [facility] ORS;  Service: Gynecology;  Laterality: N/A;   svd     x 1   TUBAL LIGATION      Current Medications: Current Meds  Medication Sig   acetaminophen (TYLENOL) 500 MG tablet Take 1,000 mg by mouth every 6 (six) hours as needed. Patient takes for pain   albuterol (VENTOLIN HFA) 108 (90 Base) MCG/ACT inhaler Inhale 2 puffs into the lungs every 4 (four) hours as needed.   amLODipine (NORVASC) 10 MG tablet Take 10 mg by mouth daily.   atorvastatin (LIPITOR) 80 MG tablet Take 1 tablet (80 mg total) by mouth daily.   cetirizine (ZYRTEC) 10 MG tablet Take 10 mg by mouth daily.   clopidogrel (PLAVIX) 75 MG tablet Take 75 mg by mouth daily.   cyanocobalamin  (VITAMIN B12) 1000 MCG tablet Take 1,000 mcg by mouth daily.   fenofibrate 160 MG tablet Take 160 mg by mouth daily.   JANUVIA 100 MG tablet Take 100 mg by mouth daily.   JARDIANCE 10 MG TABS tablet Take 10 mg by mouth daily.   lisinopril (PRINIVIL,ZESTRIL) 20 MG tablet Take 20 mg by mouth daily.   meclizine (ANTIVERT) 25 MG tablet Take 25 mg by mouth every 8 (eight) hours as needed.   ondansetron (ZOFRAN-ODT) 4 MG disintegrating tablet Take 4 mg by mouth every 8 (eight) hours as needed for nausea.   OZEMPIC, 0.25 OR 0.5 MG/DOSE, 2 MG/1.5ML SOPN SMARTSIG:0.25 Milligram(s) SUB-Q Once a Week   triamcinolone cream (KENALOG) 0.5 % Apply 1 Application topically 2 (two) times daily.   [DISCONTINUED] atorvastatin (LIPITOR) 40 MG tablet Take 40 mg  by mouth daily.   [DISCONTINUED] montelukast (SINGULAIR) 10 MG tablet Take 10 mg by mouth daily.   [DISCONTINUED] senna-docusate (SENOKOT-S) 8.6-50 MG tablet Take 2 tablets by mouth at bedtime.   [DISCONTINUED] simvastatin (ZOCOR) 20 MG tablet Take 20 mg by mouth daily.     Allergies:   Erythromycin, Tetracyclines & related, Ibuprofen [ibuprofen], Penicillins, Sulfa antibiotics, and Tegretol [carbamazepine]   Social History   Socioeconomic History   Marital status: Married    Spouse name: Not on file   Number of children: 1   Years of education: 12   Highest education level: High school graduate  Occupational History   Not on file  Tobacco Use   Smoking status: Former    Current packs/day: 0.00    Average packs/day: 1 pack/day for 12.0 years (12.0 ttl pk-yrs)    Types: Cigarettes    Start date: 53    Quit date: 10    Years since quitting: 38.2   Smokeless tobacco: Never  Vaping Use   Vaping status: Never Used  Substance and Sexual Activity   Alcohol use: Yes    Comment: occasional   Drug use: No   Sexual activity: Yes    Birth control/protection: Surgical  Other Topics Concern   Not on file  Social History Narrative   Lives at  home with husband.   Right-handed.   Caffeine use: 2 cups per day.   Social Drivers of Corporate investment banker Strain: Not on file  Food Insecurity: Not on file  Transportation Needs: Not on file  Physical Activity: Not on file  Stress: Not on file  Social Connections: Unknown (01/16/2022)   Received from [facility], [facility]   Social Network    Social Network: Not on file     Family History: The patient's family history includes COPD in her maternal grandfather; Congestive Heart Failure in her mother; Diabetes in her maternal grandmother, paternal grandfather, and paternal grandmother; Heart attack in her father; Pulmonary embolism in her father. ROS:   Please see the history of present illness.    All 14 point review of systems negative except as described per history of present illness.  EKGs/Labs/Other Studies Reviewed:    The following studies were reviewed today:   EKG:  EKG Interpretation Date/Time:  Thursday December 16 2023 09:31:54 EDT Ventricular Rate:  72 PR Interval:  158 QRS Duration:  74 QT Interval:  406 QTC Calculation: 444 R Axis:   1  Text Interpretation: Normal sinus rhythm Low voltage QRS Nonspecific T wave abnormality When compared with ECG of 26-Jun-2011 15:34, Nonspecific T wave abnormality now evident in Lateral leads Confirmed by Huntley Dec reddy [phone]) on 12/16/2023 9:59:12 AM    Recent Labs: No results found for requested labs within last 365 days.  Recent Lipid Panel No results found for: "CHOL", "TRIG", "HDL", "CHOLHDL", "VLDL", "LDLCALC", "LDLDIRECT"  Physical Exam:    VS:  BP 124/72   Pulse 72   Ht 5\' 4"  (1.626 m)   Wt 193 lb 12.8 oz (87.9 kg)   LMP 07/01/2011   SpO2 97%   BMI 33.27 kg/m     Wt Readings from Last 3 Encounters:  12/16/23 193 lb 12.8 oz (87.9 kg)  09/09/21 199 lb (90.3 kg)  09/09/20 218 lb 12.8 oz (99.2 kg)     GENERAL:  Well nourished, well developed in no acute distress NECK: No JVD; No  carotid bruits CARDIAC: RRR, S1 and S2 present, no murmurs, no rubs,  no gallops CHEST:  Clear to auscultation without rales, wheezing or rhonchi  Extremities: No pitting pedal edema. Pulses bilaterally symmetric with radial 2+ and dorsalis pedis 2+ NEUROLOGIC:  Alert and oriented x 3  Medication Adjustments/Labs and Tests Ordered: Current medicines are reviewed at length with the patient today.  Concerns regarding medicines are outlined above.  Orders Placed This Encounter  Procedures   Comprehensive metabolic panel with GFR   LONG TERM MONITOR (3-14 DAYS)   EKG 12-Lead   ECHOCARDIOGRAM COMPLETE   VAS US CAROTID   Meds ordered this encounter  Medications   atorvastatin (LIPITOR) 80 MG tablet    Sig: Take 1 tablet (80 mg total) by mouth daily.    Dispense:  90 tablet    Refill:  3    Signed, Dyon Rotert reddy Ednah Hammock, MD, MPH, [facility]. 12/16/2023 10:27 AM     [facility]

## 2023-12-16 NOTE — Patient Instructions (Addendum)
 Medication Instructions:    Increase Lipitor to 80 mg once a day  *If you need a refill on your cardiac medications before your next appointment, please call your pharmacy*   Lab Work: Your physician recommends that you return for lab work in: 3 week    You can come Monday through Friday 8:30 am to 12:00 pm and 1:15 to 4:30. You do not need to make an appointment as the order has already been placed. The labs you are going to have done are CMP  If you have labs (blood work) drawn today and your tests are completely normal, you will receive your results only by: MyChart Message (if you have MyChart) OR A paper copy in the mail If you have any lab test that is abnormal or we need to change your treatment, we will call you to review the results.   Testing/Procedu=res: Your physician has requested that you have a carotid duplex. This test is an ultrasound of the carotid arteries in your neck. It looks at blood flow through these arteries that supply the brain with blood. Allow one hour for this exam. There are no restrictions or special instructions.   Echocardiogram An echocardiogram is a test that uses sound waves (ultrasound) to produce images of the heart. Images from an echocardiogram can provide important information about: Heart size and shape. The size and thickness and movement of your heart's walls. Heart muscle function and strength. Heart valve function or if you have stenosis. Stenosis is when the heart valves are too narrow. If blood is flowing backward through the heart valves (regurgitation). A tumor or infectious growth around the heart valves. Areas of heart muscle that are not working well because of poor blood flow or injury from a heart attack. Aneurysm detection. An aneurysm is a weak or damaged part of an artery wall. The wall bulges out from the normal force of blood pumping through the body. Tell a health care provider about: Any allergies you have. All medicines  you are taking, including vitamins, herbs, eye drops, creams, and over-the-counter medicines. Any blood disorders you have. Any surgeries you have had. Any medical conditions you have. Whether you are pregnant or may be pregnant. What are the risks? Generally, this is a safe test. However, problems may occur, including an allergic reaction to dye (contrast) that may be used during the test. What happens before the test? No specific preparation is needed. You may eat and drink normally. What happens during the test?  You will take off your clothes from the waist up and put on a hospital gown. Electrodes or electrocardiogram (ECG)patches may be placed on your chest. The electrodes or patches are then connected to a device that monitors your heart rate and rhythm. You will lie down on a table for an ultrasound exam. A gel will be applied to your chest to help sound waves pass through your skin. A handheld device, called a transducer, will be pressed against your chest and moved over your heart. The transducer produces sound waves that travel to your heart and bounce back (or "echo" back) to the transducer. These sound waves will be captured in real-time and changed into images of your heart that can be viewed on a video monitor. The images will be recorded on a computer and reviewed by your health care provider. You may be asked to change positions or hold your breath for a short time. This makes it easier to get different views or better views  of your heart. In some cases, you may receive contrast through an IV in one of your veins. This can improve the quality of the pictures from your heart. The procedure may vary among health care providers and hospitals. What can I expect after the test? You may return to your normal, everyday life, including diet, activities, and medicines, unless your health care provider tells you not to do that. Follow these instructions at home: It is up to you to get the  results of your test. Ask your health care provider, or the department that is doing the test, when your results will be ready. Keep all follow-up visits. This is important. Summary An echocardiogram is a test that uses sound waves (ultrasound) to produce images of the heart. Images from an echocardiogram can provide important information about the size and shape of your heart, heart muscle function, heart valve function, and other possible heart problems. You do not need to do anything to prepare before this test. You may eat and drink normally. After the echocardiogram is completed, you may return to your normal, everyday life, unless your health care provider tells you not to do that. This information is not intended to replace advice given to you by your health care provider. Make sure you discuss any questions you have with your health care provider. Document Revised: 05/07/2021 Document Reviewed: 04/16/2020 Elsevier Patient Education  2023 Elsevier Inc.    A zio monitor was ordered today. It will remain on for 28 days. Remove 01/13/2024. You will then return monitor and event diary in provided box. It takes 1-2 weeks for report to be downloaded and returned to [place]. We will call you with the results. If monitor falls off or has orange flashing light, please call Zio for further instructions.     Follow-Up: At [facility], you and your health needs are our priority.  As part of our continuing mission to provide you with exceptional heart care, we have created designated Provider Care Teams.  These Care Teams include your primary Cardiologist (physician) and Advanced Practice Providers (APPs -  Physician Assistants and Nurse Practitioners) who all work together to provide you with the care you need, when you need it.  We recommend signing up for the patient portal called "MyChart".  Sign up information is provided on this After Visit Summary.  MyChart is used to connect with patients for  Virtual Visits (Telemedicine).  Patients are able to view lab/test results, encounter notes, upcoming appointments, etc.  Non-urgent messages can be sent to your provider as well.   To learn more about what you can do with MyChart, go to [email or web address].    Your next appointment:   Based on test results

## 2023-12-16 NOTE — Assessment & Plan Note (Addendum)
 Was previously on simvastatin at home.  Later switched to atorvastatin 40 mg once a day prior to discharge from the hospital which she has been taking consistently.  Titrate up the dose of atorvastatin to 80 mg once daily. Repeat CMP tentatively in 3 to 4 weeks to monitor liver function test.  Target LDL less than 70 mg/dL.

## 2023-12-16 NOTE — Assessment & Plan Note (Signed)
 Well-controlled on current regimen with amlodipine 10 mg once daily, lisinopril 20 mg once daily.

## 2023-12-16 NOTE — Assessment & Plan Note (Addendum)
 Stroke 11-12-2023 involving superior cerebellar peduncle on the right. Symptoms complete resolution and functional status back to baseline.  Here for cardiac evaluation.  Will schedule her for a transthoracic echocardiogram with agitated saline study to assess for any intracardiac shunt. Will schedule for 28-day heart monitor to assess for any underlying A-fib.   Currently on Plavix 75 mg once daily. Has pending follow-up with neurologist.  Also obtain ultrasound carotids

## 2023-12-27 ENCOUNTER — Ambulatory Visit (INDEPENDENT_AMBULATORY_CARE_PROVIDER_SITE_OTHER): Admitting: Neurology

## 2023-12-27 ENCOUNTER — Telehealth: Payer: Self-pay | Admitting: Neurology

## 2023-12-27 ENCOUNTER — Encounter: Payer: Self-pay | Admitting: Neurology

## 2023-12-27 VITALS — Ht 64.0 in | Wt 195.0 lb

## 2023-12-27 DIAGNOSIS — E782 Mixed hyperlipidemia: Secondary | ICD-10-CM | POA: Diagnosis not present

## 2023-12-27 DIAGNOSIS — I639 Cerebral infarction, unspecified: Secondary | ICD-10-CM | POA: Diagnosis not present

## 2023-12-27 DIAGNOSIS — I63541 Cerebral infarction due to unspecified occlusion or stenosis of right cerebellar artery: Secondary | ICD-10-CM | POA: Diagnosis not present

## 2023-12-27 NOTE — Progress Notes (Signed)
 Chief Complaint  Patient presents with   Room 14    Pt is here with her Husband. Pt states states that she has been okay, except for being tired,and having some headaches, as well as a pain in the back of her head. Pt states that her headaches come and goes. Pt states that her head pain comes and goes that last 1 min and then it is gone. Pt states that she used to go and go all of the time and now she can't due to her being exhausted all of the time.        ASSESSMENT AND PLAN  Kristine Horne is a 63 y.o. female   Stroke  Multiple vascular risk factors, hypertension, hyperlipidemia, diabetes, obesity obstructive sleep apnea  On Plavix 75 mg daily now  Medical record, including MRI of the brain to reveal History of right trigeminal microvascular decompression,  Doing well  Return To Clinic With NP In 6 Months   DIAGNOSTIC DATA (LABS, IMAGING, TESTING) - I reviewed patient records, labs, notes, testing and imaging myself where available.   MEDICAL HISTORY:  Kristine Horne is a 63 year old female, seen in request by her primary care from [facility] Dr. Lilyan Remedies, [place] to follow-up on stroke, she is accompanied by her husband  History is obtained from the patient and review of electronic medical records. I personally reviewed pertinent available imaging films in PACS.   PMHx of  DM HTN HLD Stroke in March 2025, was treated at [place],  [facility].  Patient had a history of right trigeminal neuralgia involving right V2, V3 branches,in 2017, notable recurrent pain, despite polypharmacy eventually underwent microvascular decompression surgery at [facility] by Dr. Otha Blight in August 2021. She did very well.  She has been followed up by our clinic for obstructive sleep apnea, compliant with her CPAP  She works a Health and safety inspector job, admitted to [facility] on March 7 after wake-up with severe vertigo, nausea vomiting, the day before March 6, she had intermittent right arm and leg  weakness  I could not access the MRI of her medical record, she was allergic to aspirin, since stroke, she was put on Plavix 75 mg daily, now almost back to her baseline with exception of excessive fatigue  She was seen by cardiologist Dr. Ronell Coe on December 16, 2023, hyperlipidemia, increase her Lipitor to 80 mg daily, target LDL less than 70, hypertension, well-controlled with amlodipine 10 mg and lisinopril 20 mg,  EKG showed sinus rhythm of 72 bpm  Documented stroke involving superior cerebellar peduncle on the right,  Echocardiogram with agitated saline is scheduled to rule out intracardiac shunt  She is also scheduled for 28 days cardiac monitoring to rule out underlying atrial fibrillation  Per record, echocardiogram and ultrasound of carotid artery showed no significant abnormalities.  PHYSICAL EXAM:   Vitals:   12/27/23 1301  Weight: 195 lb (88.5 kg)  Height: 5\' 4"  (1.626 m)   Body mass index is 33.47 kg/m.  PHYSICAL EXAMNIATION:  Gen: NAD, conversant, well nourised, well groomed                     Cardiovascular: Regular rate rhythm, no peripheral edema, warm, nontender. Eyes: Conjunctivae clear without exudates or hemorrhage Neck: Supple, no carotid bruits. Pulmonary: Clear to auscultation bilaterally   NEUROLOGICAL EXAM:  MENTAL STATUS: Speech/cognition: Awake, alert, oriented to history taking and casual conversation CRANIAL NERVES: CN II: Visual fields are full to confrontation. Pupils are round equal and  briskly reactive to light. CN III, IV, VI: extraocular movement are normal. No ptosis. CN V: Facial sensation is intact to light touch CN VII: Face is symmetric with normal eye closure  CN VIII: Hearing is normal to causal conversation. CN IX, X: Phonation is normal. CN XI: Head turning and shoulder shrug are intact  MOTOR: There is no pronator drift of out-stretched arms. Muscle bulk and tone are normal. Muscle strength is  normal.  REFLEXES: Reflexes are 2 and symmetric at the biceps, triceps, knees, and ankles. Plantar responses are flexor.  SENSORY: Intact to light touch, pinprick and vibratory sensation are intact in fingers and toes.  COORDINATION: There is no trunk or limb dysmetria noted.  GAIT/STANCE: Push-up to get up from seated position, steady  REVIEW OF SYSTEMS:  Full 14 system review of systems performed and notable only for as above All other review of systems were negative.   ALLERGIES: Allergies  Allergen Reactions   Erythromycin Nausea And Vomiting   Tetracyclines & Related Hives   Ibuprofen [Ibuprofen] Swelling   Penicillins Swelling   Sulfa Antibiotics Hives   Tegretol [Carbamazepine] Nausea And Vomiting    HOME MEDICATIONS: Current Outpatient Medications  Medication Sig Dispense Refill   acetaminophen  (TYLENOL ) 500 MG tablet Take 1,000 mg by mouth every 6 (six) hours as needed. Patient takes for pain     albuterol (VENTOLIN HFA) 108 (90 Base) MCG/ACT inhaler Inhale 2 puffs into the lungs every 4 (four) hours as needed.     amLODipine (NORVASC) 10 MG tablet Take 10 mg by mouth daily.     atorvastatin  (LIPITOR) 80 MG tablet Take 1 tablet (80 mg total) by mouth daily. 90 tablet 3   cetirizine (ZYRTEC) 10 MG tablet Take 10 mg by mouth daily.     clopidogrel (PLAVIX) 75 MG tablet Take 75 mg by mouth daily.     cyanocobalamin (VITAMIN B12) 1000 MCG tablet Take 1,000 mcg by mouth daily.     fenofibrate 160 MG tablet Take 160 mg by mouth daily.     JANUVIA 100 MG tablet Take 100 mg by mouth daily.     JARDIANCE 10 MG TABS tablet Take 10 mg by mouth daily.     lisinopril (PRINIVIL,ZESTRIL) 20 MG tablet Take 20 mg by mouth daily.     meclizine (ANTIVERT) 25 MG tablet Take 25 mg by mouth every 8 (eight) hours as needed.     ondansetron  (ZOFRAN -ODT) 4 MG disintegrating tablet Take 4 mg by mouth every 8 (eight) hours as needed for nausea.     OZEMPIC, 0.25 OR 0.5 MG/DOSE, 2 MG/1.5ML  SOPN SMARTSIG:0.25 Milligram(s) SUB-Q Once a Week     triamcinolone cream (KENALOG) 0.5 % Apply 1 Application topically 2 (two) times daily.     No current facility-administered medications for this visit.    PAST MEDICAL HISTORY: Past Medical History:  Diagnosis Date   Anxiety    no meds   Arthritis    ankles   Asthma    Asthma 12/12/2011   Chronic kidney disease    hx kidney stone   Class 2 severe obesity due to excess calories with serious comorbidity and body mass index (BMI) of 35.0 to 35.9 in adult (HCC) 07/01/2017   Diabetes mellitus    Diabetes mellitus (HCC) 12/15/2023   Type 2     DM type 2 with diabetic mixed hyperlipidemia (HCC) 04/02/2016   History of adenomatous polyp of colon 01/27/2017   H/o adenomas 01/27/17; repeat 01/2022; Polly Brink  Shirlie Dove, MD [facility])     Hyperlipidemia    Hypertension    Obesity (BMI 30-39.9) 07/03/2016   OSA on CPAP 09/09/2020   Positive colorectal cancer screening using DNA-based stool test 12/01/2016   S/P tubal ligation    Seasonal allergies    TMJ (dislocation of temporomandibular joint) 09/10/2015   Trigeminal neuralgia    right    PAST SURGICAL HISTORY: Past Surgical History:  Procedure Laterality Date   CEREBRAL MICROVASCULAR DECOMPRESSION Right 04/2020   DENTAL SURGERY     upper bridge   LAPAROSCOPY  07/08/2011   Procedure: LAPAROSCOPY OPERATIVE;  Surgeon: Lillie Reining, MD;  Location: [facility];  Service: Gynecology;  Laterality: N/A;   OVARIAN CYST REMOVAL  07/08/2011   Procedure: OVARIAN CYSTECTOMY;  Surgeon: Lillie Reining, MD;  Location: [facility];  Service: Gynecology;  Laterality: N/A;   svd     x 1   TUBAL LIGATION      FAMILY HISTORY: Family History  Problem Relation Age of Onset   Congestive Heart Failure Mother    Heart attack Father    Pulmonary embolism Father    Diabetes Maternal Grandmother    COPD Maternal Grandfather    Diabetes Paternal Grandmother    Diabetes Paternal Grandfather     SOCIAL  HISTORY: Social History   Socioeconomic History   Marital status: Married    Spouse name: Not on file   Number of children: 1   Years of education: 12   Highest education level: High school graduate  Occupational History   Not on file  Tobacco Use   Smoking status: Former    Current packs/day: 0.00    Average packs/day: 1 pack/day for 12.0 years (12.0 ttl pk-yrs)    Types: Cigarettes    Start date: 75    Quit date: 26    Years since quitting: 38.3   Smokeless tobacco: Never  Vaping Use   Vaping status: Never Used  Substance and Sexual Activity   Alcohol use: Yes    Comment: occasional   Drug use: No   Sexual activity: Yes    Birth control/protection: Surgical  Other Topics Concern   Not on file  Social History Narrative   Lives at home with husband.   Right-handed.   Caffeine use: 2 cups per day.   Social Drivers of Corporate investment banker Strain: Not on file  Food Insecurity: Not on file  Transportation Needs: Not on file  Physical Activity: Not on file  Stress: Not on file  Social Connections: Unknown (01/16/2022)   Received from [facility]   Social Network    Social Network: Not on file  Intimate Partner Violence: Unknown (12/08/2021)   Received from [facility], [facility]   HITS    Physically Hurt: Not on file    Insult or Talk Down To: Not on file    Threaten Physical Harm: Not on file    Scream or Curse: Not on file      Phebe Brasil, M.D. Ph.D.  [facility] [address] [place], [place] [address] Ph: [phone] Fax: [phone]  CC:  Rhonda Certain, MD [address] [place] [address]  Elester Grim, MD

## 2023-12-27 NOTE — Telephone Encounter (Signed)
 Get medical record from [place] from her stroke admission on March 7th 2025, including mRI brain CD to review

## 2024-01-03 NOTE — Telephone Encounter (Signed)
 MRI Disc received - placed on MD's desk

## 2024-01-11 NOTE — Telephone Encounter (Signed)
 Pt cd file in Medical records

## 2024-01-12 ENCOUNTER — Ambulatory Visit

## 2024-01-12 ENCOUNTER — Ambulatory Visit (INDEPENDENT_AMBULATORY_CARE_PROVIDER_SITE_OTHER)

## 2024-01-12 DIAGNOSIS — I63541 Cerebral infarction due to unspecified occlusion or stenosis of right cerebellar artery: Secondary | ICD-10-CM | POA: Diagnosis not present

## 2024-01-12 LAB — ECHOCARDIOGRAM COMPLETE: S' Lateral: 3.7 cm

## 2024-01-13 LAB — COMPREHENSIVE METABOLIC PANEL WITH GFR
ALT: 23 IU/L (ref 0–32)
AST: 16 IU/L (ref 0–40)
Albumin: 4.4 g/dL (ref 3.9–4.9)
Alkaline Phosphatase: 76 IU/L (ref 44–121)
BUN/Creatinine Ratio: 15 (ref 12–28)
BUN: 13 mg/dL (ref 8–27)
Bilirubin Total: 0.4 mg/dL (ref 0.0–1.2)
CO2: 22 mmol/L (ref 20–29)
Calcium: 9.4 mg/dL (ref 8.7–10.3)
Chloride: 104 mmol/L (ref 96–106)
Creatinine, Ser: 0.89 mg/dL (ref 0.57–1.00)
Globulin, Total: 2.4 g/dL (ref 1.5–4.5)
Glucose: 137 mg/dL — ABNORMAL HIGH (ref 70–99)
Potassium: 4.7 mmol/L (ref 3.5–5.2)
Sodium: 141 mmol/L (ref 134–144)
Total Protein: 6.8 g/dL (ref 6.0–8.5)
eGFR: 73 mL/min/{1.73_m2} (ref >59)

## 2024-01-24 ENCOUNTER — Ambulatory Visit: Payer: Self-pay

## 2024-01-24 DIAGNOSIS — I63541 Cerebral infarction due to unspecified occlusion or stenosis of right cerebellar artery: Secondary | ICD-10-CM | POA: Diagnosis not present

## 2024-01-27 ENCOUNTER — Other Ambulatory Visit: Payer: Self-pay

## 2024-01-28 ENCOUNTER — Ambulatory Visit

## 2024-01-28 VITALS — BP 150/70 | HR 59 | Ht 64.0 in | Wt 191.0 lb

## 2024-01-28 DIAGNOSIS — I63541 Cerebral infarction due to unspecified occlusion or stenosis of right cerebellar artery: Secondary | ICD-10-CM

## 2024-01-28 DIAGNOSIS — I10 Essential (primary) hypertension: Secondary | ICD-10-CM | POA: Diagnosis not present

## 2024-01-28 DIAGNOSIS — E782 Mixed hyperlipidemia: Secondary | ICD-10-CM

## 2024-01-28 DIAGNOSIS — I340 Nonrheumatic mitral (valve) insufficiency: Secondary | ICD-10-CM | POA: Diagnosis not present

## 2024-01-28 NOTE — Patient Instructions (Signed)
 Medication Instructions:  Your physician recommends that you continue on your current medications as directed. Please refer to the Current Medication list given to you today.  *If you need a refill on your cardiac medications before your next appointment, please call your pharmacy*  Lab Work: None If you have labs (blood work) drawn today and your tests are completely normal, you will receive your results only by: MyChart Message (if you have MyChart) OR A paper copy in the mail If you have any lab test that is abnormal or we need to change your treatment, we will call you to review the results.  Testing/Procedures: Your physician has requested that you have an echocardiogram. Echocardiography is a painless test that uses sound waves to create images of your heart. It provides your doctor with information about the size and shape of your heart and how well your heart's chambers and valves are working. This procedure takes approximately one hour. There are no restrictions for this procedure. Please do NOT wear cologne, perfume, aftershave, or lotions (deodorant is allowed). Please arrive 15 minutes prior to your appointment time.  Please note: We ask at that you not bring children with you during ultrasound (echo/ vascular) testing. Due to room size and safety concerns, children are not allowed in the ultrasound rooms during exams. Our front office staff cannot provide observation of children in our lobby area while testing is being conducted. An adult accompanying a patient to their appointment will only be allowed in the ultrasound room at the discretion of the ultrasound technician under special circumstances. We apologize for any inconvenience.   Follow-Up: At [facility], you and your health needs are our priority.  As part of our continuing mission to provide you with exceptional heart care, our providers are all part of one team.  This team includes your primary Cardiologist  (physician) and Advanced Practice Providers or APPs (Physician Assistants and Nurse Practitioners) who all work together to provide you with the care you need, when you need it.  Your next appointment:   Follow up in December 2026  Provider:   Bertha Broad, MD    We recommend signing up for the patient portal called "MyChart".  Sign up information is provided on this After Visit Summary.  MyChart is used to connect with patients for Virtual Visits (Telemedicine).  Patients are able to view lab/test results, encounter notes, upcoming appointments, etc.  Non-urgent messages can be sent to your provider as well.   To learn more about what you can do with MyChart, go to [email or web address].   Other Instructions None

## 2024-01-28 NOTE — Assessment & Plan Note (Signed)
 Well-controlled at home. At healthcare visits mentions tends to run high. Advised her to continue close monitoring at home and target below 130/80 mmHg.  Also suggested she have her home device checked at one of the healthcare providers visits.  At this time continue current medications  amlodipine 10 mg once daily Lisinopril 20 mg once daily and Metoprolol succinate 50 mg once daily.

## 2024-01-28 NOTE — Progress Notes (Signed)
 Cardiology Consultation:    Date:  01/28/2024   ID:  Kristine Horne, DOB Dec 11, 1960, MRN [id number]  PCP:  Kristine Grim, MD  Cardiologist:  Kristine Evans Kimberly Coye, MD   Referring MD: Kristine Grim, MD   Chief Complaint  Patient presents with   Follow-up     ASSESSMENT AND PLAN:   Kristine Horne 63 year old woman with history of multifocal posterior circulatory stroke 11-12-2023 no significant residual deficits, diabetes mellitus, hypertension, hyperlipidemia, aspirin allergy [remains on Plavix for antiplatelet therapy], mild mitral regurgitation on echocardiogram May 2025.  Problem List Items Addressed This Visit     Hyperlipidemia - Primary   Continue with atorvastatin  80 mg once daily. CMP noted stable transaminases and alkaline phosphatase 01-12-2024.       Relevant Medications   metoprolol succinate (TOPROL-XL) 50 MG 24 hr tablet   Other Relevant Orders   ECHOCARDIOGRAM COMPLETE   Hypertension   Well-controlled at home. At healthcare visits mentions tends to run high. Advised her to continue close monitoring at home and target below 130/80 mmHg.  Also suggested she have her home device checked at one of the healthcare providers visits.  At this time continue current medications  amlodipine 10 mg once daily Lisinopril 20 mg once daily and Metoprolol succinate 50 mg once daily.       Relevant Medications   metoprolol succinate (TOPROL-XL) 50 MG 24 hr tablet   Other Relevant Orders   ECHOCARDIOGRAM COMPLETE   Stroke [facility])   History of stroke 11-12-2023 involving superior cerebellar peduncle on the right.  Establish care with [facility]. Zio patch 12/16/2023: 28 days study noted no evidence of atrial fibrillation. Transthoracic echocardiogram 01/12/2024 with normal biventricular function, mild MR. No atrial shunt on color Doppler.  No bubble study done.  At this time we will defer to neurology consultant if further evaluation for atrial level shunt  with TEE is required based on what they feel is the underlying etiology for the stroke.  If neurologist recommends TEE, will schedule for the same.  Advised her to reach back to our office after her follow-up visit with the neurologist.      Relevant Medications   metoprolol succinate (TOPROL-XL) 50 MG 24 hr tablet   Other Relevant Orders   ECHOCARDIOGRAM COMPLETE   Mild mitral regurgitation   Noted on echocardiogram May 2025.  Repeat echocardiogram tentatively in 18 months for interval follow-up      Relevant Medications   metoprolol succinate (TOPROL-XL) 50 MG 24 hr tablet   Other Relevant Orders   ECHOCARDIOGRAM COMPLETE   Will follow-up if TEE is recommended by neurologist. Otherwise follow-up in 18 months with a transthoracic echocardiogram for interval assessment of mitral regurgitation.   History of Present Illness:    Kristine Horne is a 63 y.o. female who is being seen today for follow-up visit PCP is Kristine Horne, Kristine Caprio, MD. Last visit with me in the office was 12-16-2023.  History of multifocal posterior circulatory stroke 11-12-2023 no significant residual deficits, diabetes mellitus, hypertension, hyperlipidemia, aspirin allergy [remains on Plavix for antiplatelet therapy], mild mitral regurgitation on echocardiogram May 2025.  Further evaluation from cardiac standpoint done with ultrasound carotids, transthoracic echocardiogram and 28 days of patch monitor.  Zio patch monitor 28 days from 12-16-2023 noted predominantly sinus rhythm, no evidence of atrial fibrillation.  Rare ventricular and supraventricular ectopy burden less than 1%, 1 patient triggered event correlated with normal heart rate and rhythm.  Average heart rate 69 to  70 bpm.  Ultrasound carotid vessels 01-12-2024 noted no significant abnormalities.  Transthoracic echocardiogram 01-12-2024 noted normal biventricular function LVEF 60 to 65%, mild mitral regurgitation.  Unfortunately only color Doppler evaluation  for atrial level shunt was done and this was negative.  No bubble study was performed.   Overall here today at follow-up visit mentions she has been doing well. No further symptoms. Taking her medications consistently and no side effects.   Blood work from 01-12-2024 noted BUN 13, creatinine 0.89, EGFR 73 Normal transaminases and alkaline phosphatase follow-up of ongoing statin therapy.  Stable results should continue with statins.  Past Medical History:  Diagnosis Date   Anxiety    no meds   Arthritis    ankles   Asthma 12/12/2011   Chronic kidney disease    hx kidney stone   Class 2 severe obesity due to excess calories with serious comorbidity and body mass index (BMI) of 35.0 to 35.9 in adult (HCC) 07/01/2017   Diabetes mellitus (HCC) 12/15/2023   Type 2     DM type 2 with diabetic mixed hyperlipidemia (HCC) 04/02/2016   History of adenomatous polyp of colon 01/27/2017   H/o adenomas 01/27/17; repeat 01/2022; Kristine Guardian, MD [facility])     Hyperlipidemia    Hypertension    Obesity (BMI 30-39.9) 07/03/2016   OSA on CPAP 09/09/2020   Positive colorectal cancer screening using DNA-based stool test 12/01/2016   S/P tubal ligation    Seasonal allergies    Stroke (HCC) 12/16/2023   TMJ (dislocation of temporomandibular joint) 09/10/2015   Trigeminal neuralgia    right    Past Surgical History:  Procedure Laterality Date   CEREBRAL MICROVASCULAR DECOMPRESSION Right 04/2020   DENTAL SURGERY     upper bridge   LAPAROSCOPY  07/08/2011   Procedure: LAPAROSCOPY OPERATIVE;  Surgeon: Kristine Reining, MD;  Location: [facility];  Service: Gynecology;  Laterality: N/A;   OVARIAN CYST REMOVAL  07/08/2011   Procedure: OVARIAN CYSTECTOMY;  Surgeon: Kristine Reining, MD;  Location: [facility];  Service: Gynecology;  Laterality: N/A;   svd     x 1   TUBAL LIGATION      Current Medications: Current Meds  Medication Sig   acetaminophen  (TYLENOL ) 500 MG tablet Take 1,000 mg by mouth every 6  (six) hours as needed for mild pain (pain score 1-3) or moderate pain (pain score 4-6).   albuterol (VENTOLIN HFA) 108 (90 Base) MCG/ACT inhaler Inhale 2 puffs into the lungs every 4 (four) hours as needed for wheezing or shortness of breath.   amLODipine (NORVASC) 10 MG tablet Take 10 mg by mouth daily.   atorvastatin  (LIPITOR) 80 MG tablet Take 1 tablet (80 mg total) by mouth daily.   cetirizine (ZYRTEC) 10 MG tablet Take 10 mg by mouth daily.   clopidogrel (PLAVIX) 75 MG tablet Take 75 mg by mouth daily.   cyanocobalamin (VITAMIN B12) 1000 MCG tablet Take 1,000 mcg by mouth daily.   fenofibrate 160 MG tablet Take 160 mg by mouth daily.   JANUVIA 100 MG tablet Take 100 mg by mouth daily.   JARDIANCE 10 MG TABS tablet Take 10 mg by mouth daily.   lisinopril (PRINIVIL,ZESTRIL) 20 MG tablet Take 20 mg by mouth daily.   meclizine (ANTIVERT) 25 MG tablet Take 25 mg by mouth every 8 (eight) hours as needed for dizziness.   metoprolol succinate (TOPROL-XL) 50 MG 24 hr tablet Take 50 mg by mouth daily. Take with or immediately  following a meal.   ondansetron  (ZOFRAN -ODT) 4 MG disintegrating tablet Take 4 mg by mouth every 8 (eight) hours as needed for nausea.   triamcinolone cream (KENALOG) 0.5 % Apply 1 Application topically 2 (two) times daily.     Allergies:   Erythromycin, Tetracyclines & related, Ibuprofen [ibuprofen], Penicillins, Sulfa antibiotics, and Tegretol [carbamazepine]   Social History   Socioeconomic History   Marital status: Married    Spouse name: Not on file   Number of children: 1   Years of education: 12   Highest education level: High school graduate  Occupational History   Not on file  Tobacco Use   Smoking status: Former    Current packs/day: 0.00    Average packs/day: 1 pack/day for 12.0 years (12.0 ttl pk-yrs)    Types: Cigarettes    Start date: 72    Quit date: 25    Years since quitting: 38.4   Smokeless tobacco: Never  Vaping Use   Vaping status:  Never Used  Substance and Sexual Activity   Alcohol use: Yes    Comment: occasional   Drug use: No   Sexual activity: Yes    Birth control/protection: Surgical  Other Topics Concern   Not on file  Social History Narrative   Lives at home with husband.   Right-handed.   Caffeine use: 2 cups per day.   Social Drivers of Corporate investment banker Strain: Not on file  Food Insecurity: Not on file  Transportation Needs: Not on file  Physical Activity: Not on file  Stress: Not on file  Social Connections: Unknown (01/16/2022)   Received from [facility], [facility]   Social Network    Social Network: Not on file     Family History: The patient's family history includes COPD in her maternal grandfather; Congestive Heart Failure in her mother; Diabetes in her maternal grandmother, paternal grandfather, and paternal grandmother; Heart attack in her father; Pulmonary embolism in her father. ROS:   Please see the history of present illness.    All 14 point review of systems negative except as described per history of present illness.  EKGs/Labs/Other Studies Reviewed:    The following studies were reviewed today:   EKG:       Recent Labs: 01/12/2024: ALT 23; BUN 13; Creatinine, Ser 0.89; Potassium 4.7; Sodium 141  Recent Lipid Panel No results found for: "CHOL", "TRIG", "HDL", "CHOLHDL", "VLDL", "LDLCALC", "LDLDIRECT"  Physical Exam:    VS:  BP (!) 150/70   Pulse (!) 59   Ht 5\' 4"  (1.626 m)   Wt 191 lb (86.6 kg)   LMP 07/01/2011   SpO2 95%   BMI 32.79 kg/m     Wt Readings from Last 3 Encounters:  01/28/24 191 lb (86.6 kg)  12/27/23 195 lb (88.5 kg)  12/16/23 193 lb 12.8 oz (87.9 kg)     GENERAL:  Well nourished, well developed in no acute distress NECK: No JVD; No carotid bruits CARDIAC: RRR, S1 and S2 present, no murmurs, no rubs, no gallops CHEST:  Clear to auscultation without rales, wheezing or rhonchi  Extremities: No pitting pedal edema. Pulses  bilaterally symmetric with radial 2+ and dorsalis pedis 2+ NEUROLOGIC:  Alert and oriented x 3  Medication Adjustments/Labs and Tests Ordered: Current medicines are reviewed at length with the patient today.  Concerns regarding medicines are outlined above.  Orders Placed This Encounter  Procedures   ECHOCARDIOGRAM COMPLETE   No orders of the defined types were  placed in this encounter.   Signed, Lura Sallies, MD, MPH, [facility]. 01/28/2024 2:40 PM    [facility]

## 2024-01-28 NOTE — Assessment & Plan Note (Signed)
 History of stroke 11-12-2023 involving superior cerebellar peduncle on the right.  Establish care with [facility]. Zio patch 12/16/2023: 28 days study noted no evidence of atrial fibrillation. Transthoracic echocardiogram 01/12/2024 with normal biventricular function, mild MR. No atrial shunt on color Doppler.  No bubble study done.  At this time we will defer to neurology consultant if further evaluation for atrial level shunt with TEE is required based on what they feel is the underlying etiology for the stroke.  If neurologist recommends TEE, will schedule for the same.  Advised her to reach back to our office after her follow-up visit with the neurologist.

## 2024-01-28 NOTE — Assessment & Plan Note (Signed)
 Continue with atorvastatin  80 mg once daily. CMP noted stable transaminases and alkaline phosphatase 01-12-2024.

## 2024-01-28 NOTE — Assessment & Plan Note (Signed)
 Noted on echocardiogram May 2025.  Repeat echocardiogram tentatively in 18 months for interval follow-up

## 2024-02-14 ENCOUNTER — Telehealth: Payer: Self-pay | Admitting: Neurology

## 2024-02-14 MED ORDER — CLOPIDOGREL BISULFATE 75 MG PO TABS: 75.0000 mg | ORAL_TABLET | Freq: Every day | ORAL | 3 refills | Status: AC

## 2024-02-14 NOTE — Telephone Encounter (Signed)
 clopidogrel (PLAVIX) 75 MG tablet  Pt called to request medication . Pt stated that PCP  would not be able to refill this medication due to not knowing if pt should continue to take medication . Pt want to speak to  Dr Gracie Lav to see if she can fill medication   Pt would like medication sent to  [facility], [place] - [address]

## 2024-02-14 NOTE — Telephone Encounter (Signed)
 Phone room: Notify pt that itsbeen refilled

## 2024-06-19 ENCOUNTER — Ambulatory Visit: Admitting: Neurology

## 2024-07-03 ENCOUNTER — Ambulatory Visit: Admitting: Neurology

## 2024-07-03 ENCOUNTER — Encounter: Payer: Self-pay | Admitting: Neurology

## 2024-07-03 VITALS — BP 127/77 | HR 64 | Resp 15 | Ht 64.0 in | Wt 173.5 lb

## 2024-07-03 DIAGNOSIS — I639 Cerebral infarction, unspecified: Secondary | ICD-10-CM | POA: Diagnosis not present

## 2024-07-03 DIAGNOSIS — G4733 Obstructive sleep apnea (adult) (pediatric): Secondary | ICD-10-CM

## 2024-07-03 NOTE — Progress Notes (Unsigned)
 Chief Complaint  Patient presents with   Cerebrovascular Accident    Rm14, husband present, Cva followup: pt stated that she gets tired easier, word finding issues, and headaches ( 2/30 days, triggers: stress).       ASSESSMENT AND PLAN  Kristine Horne is a 63 y.o. female   Stroke on March 7th 2025, treated at [facility]  Multiple vascular risk factors, hypertension, hyperlipidemia, diabetes, obesity obstructive sleep apnea  On Plavix  75 mg daily now  Reviewed medical record, has completed stroke workup.  History of right trigeminal microvascular decompression for right trigeminal neuralgia,  Doing well, no longer require neuropathic pain medications  Follow-up with primary care only return to clinic for new issues  DIAGNOSTIC DATA (LABS, IMAGING, TESTING) - I reviewed patient records, labs, notes, testing and imaging myself where available.   MEDICAL HISTORY:  Kristine Horne is a 63 year old female, seen in request by her primary care from [facility] [place], [place] to follow-up on stroke, she is accompanied by her husband  History is obtained from the patient and review of electronic medical records. I personally reviewed pertinent available imaging films in PACS.   PMHx of  DM HTN HLD Stroke in March 2025, was treated at [place],  [facility].  Patient had a history of right trigeminal neuralgia involving right V2, V3 branches,in 2017, notable recurrent pain, despite polypharmacy eventually underwent microvascular decompression surgery at [facility] by Dr. Rosine in August 2021. She did very well.  She has been followed up by our clinic for obstructive sleep apnea, compliant with her CPAP  She works a health and safety inspector job, admitted to [facility] on March 7 after wake-up with severe vertigo, nausea vomiting, the day before March 6, she had intermittent right arm and leg weakness  I could not access the MRI of her medical record, she was allergic to aspirin, since stroke,  she was put on Plavix  75 mg daily, now almost back to her baseline with exception of excessive fatigue  She was seen by cardiologist Dr. Liborio on December 16, 2023, hyperlipidemia, increase her Lipitor to 80 mg daily, target LDL less than 70, hypertension, well-controlled with amlodipine 10 mg and lisinopril 20 mg,  EKG showed sinus rhythm of 72 bpm  Documented stroke involving superior cerebellar peduncle on the right,  Echocardiogram with agitated saline is scheduled to rule out intracardiac shunt  She is also scheduled for 28 days cardiac monitoring to rule out underlying atrial fibrillation  Per record, echocardiogram and ultrasound of carotid artery showed no significant abnormalities.  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  PHYSICAL EXAMNIATION:  Gen: NAD, conversant, well nourised, well groomed                     Cardiovascular: Regular rate rhythm, no peripheral edema, warm, nontender. Eyes: Conjunctivae clear without exudates or hemorrhage Neck: Supple, no carotid bruits. Pulmonary:  Clear to auscultation bilaterally   NEUROLOGICAL EXAM:  MENTAL STATUS: Speech/cognition: Awake, alert, oriented to history taking and casual conversation CRANIAL NERVES: CN II: Visual fields are full to confrontation. Pupils are round equal and briskly reactive to light. CN III, IV, VI:  extraocular movement are normal. No ptosis. CN V: Facial sensation is intact to light touch CN VII: Face is symmetric with normal eye closure  CN VIII: Hearing is normal to causal conversation. CN IX, X: Phonation is normal. CN XI: Head turning and shoulder shrug are intact  MOTOR: There is no pronator drift of out-stretched arms. Muscle bulk and tone are normal. Muscle strength is normal.  REFLEXES: Reflexes are 2 and symmetric at the biceps, triceps, knees, and ankles. Plantar responses are flexor.  SENSORY: Intact to light touch, pinprick and vibratory sensation are intact in fingers and toes.  COORDINATION: There is no trunk or limb dysmetria noted.  GAIT/STANCE: Push-up to get up from seated position, steady mild difficulty with tandem walking  REVIEW OF SYSTEMS:  Full 14 system review of systems performed and notable only for as above All other review of systems were negative.   ALLERGIES: Allergies  Allergen Reactions   Erythromycin Nausea And Vomiting   Tetracyclines & Related Hives   Ibuprofen [Ibuprofen] Swelling   Penicillins Swelling   Sulfa Antibiotics Hives   Tegretol [Carbamazepine] Nausea And Vomiting    HOME MEDICATIONS: Current Outpatient Medications  Medication Sig Dispense Refill   acetaminophen  (TYLENOL ) 500 MG tablet Take 1,000 mg by mouth every 6 (six) hours as needed for mild pain (pain score 1-3) or moderate pain (pain score 4-6).     albuterol (VENTOLIN HFA) 108 (90 Base) MCG/ACT inhaler Inhale 2 puffs into the lungs every 4 (four) hours as needed for wheezing or shortness of breath.     amLODipine (NORVASC) 10 MG tablet Take 10 mg by mouth daily.     atorvastatin  (LIPITOR) 80 MG tablet Take 1 tablet (80 mg total) by mouth daily. 90 tablet 3   cetirizine (ZYRTEC) 10 MG tablet Take 10 mg by mouth daily.     clopidogrel  (PLAVIX ) 75 MG tablet Take 1 tablet (75 mg total) by mouth daily. 90 tablet 3   cyanocobalamin (VITAMIN B12) 1000 MCG tablet Take  1,000 mcg by mouth daily.     fenofibrate 160 MG tablet Take 160 mg by mouth daily.     JANUVIA 100 MG tablet Take 100 mg by mouth daily.     JARDIANCE 10 MG TABS tablet Take 10 mg by mouth daily.     lisinopril (PRINIVIL,ZESTRIL) 20 MG tablet Take 20 mg by mouth daily.     meclizine (ANTIVERT) 25 MG tablet Take 25 mg by mouth every 8 (eight) hours as needed for dizziness.     metoprolol succinate (TOPROL-XL) 50 MG 24 hr tablet Take 50 mg by mouth daily. Take with or immediately following a meal.     ondansetron  (ZOFRAN -ODT) 4 MG disintegrating tablet Take 4 mg by mouth every 8 (eight) hours as needed for nausea.     triamcinolone cream (KENALOG) 0.5 % Apply 1 Application topically 2 (two) times daily.     WEGOVY 1 MG/0.5ML SOAJ SQ injection Inject 1 mg into the skin every 7 (seven) days.     No current facility-administered medications for this visit.    PAST MEDICAL HISTORY: Past Medical History:  Diagnosis Date   Anxiety    no meds   Arthritis    ankles  Asthma 12/12/2011   Chronic kidney disease    hx kidney stone   Class 2 severe obesity due to excess calories with serious comorbidity and body mass index (BMI) of 35.0 to 35.9 in adult 07/01/2017   Diabetes mellitus (HCC) 12/15/2023   Type 2     DM type 2 with diabetic mixed hyperlipidemia (HCC) 04/02/2016   History of adenomatous polyp of colon 01/27/2017   H/o adenomas 01/27/17; repeat 01/2022; Redell GORMAN Sharps, MD [facility])     Hyperlipidemia    Hypertension    Obesity (BMI 30-39.9) 07/03/2016   OSA on CPAP 09/09/2020   Positive colorectal cancer screening using DNA-based stool test 12/01/2016   S/P tubal ligation    Seasonal allergies    Stroke (HCC) 12/16/2023   TMJ (dislocation of temporomandibular joint) 09/10/2015   Trigeminal neuralgia    right    PAST SURGICAL HISTORY: Past Surgical History:  Procedure Laterality Date   CEREBRAL MICROVASCULAR DECOMPRESSION Right 04/2020   DENTAL SURGERY     upper bridge    LAPAROSCOPY  07/08/2011   Procedure: LAPAROSCOPY OPERATIVE;  Surgeon: Krystal JONETTA Deaner, MD;  Location: [facility];  Service: Gynecology;  Laterality: N/A;   OVARIAN CYST REMOVAL  07/08/2011   Procedure: OVARIAN CYSTECTOMY;  Surgeon: Krystal JONETTA Deaner, MD;  Location: [facility];  Service: Gynecology;  Laterality: N/A;   svd     x 1   TUBAL LIGATION      FAMILY HISTORY: Family History  Problem Relation Age of Onset   Congestive Heart Failure Mother    Heart attack Father    Pulmonary embolism Father    Diabetes Maternal Grandmother    COPD Maternal Grandfather    Diabetes Paternal Grandmother    Diabetes Paternal Grandfather     SOCIAL HISTORY: Social History   Socioeconomic History   Marital status: Married    Spouse name: Not on file   Number of children: 1   Years of education: 12   Highest education level: High school graduate  Occupational History   Not on file  Tobacco Use   Smoking status: Former    Current packs/day: 0.00    Average packs/day: 1 pack/day for 12.0 years (12.0 ttl pk-yrs)    Types: Cigarettes    Start date: 79    Quit date: 54    Years since quitting: 38.8   Smokeless tobacco: Never  Vaping Use   Vaping status: Never Used  Substance and Sexual Activity   Alcohol use: Yes    Comment: occasional   Drug use: No   Sexual activity: Yes    Birth control/protection: Surgical  Other Topics Concern   Not on file  Social History Narrative   Lives at home with husband.   Right-handed.   Caffeine use: 2 cups per day.   Social Drivers of Corporate Investment Banker Strain: Not on file  Food Insecurity: Not on file  Transportation Needs: Not on file  Physical Activity: Not on file  Stress: Not on file  Social Connections: Unknown (01/16/2022)   Received from [facility]   Social Network    Social Network: Not on file  Intimate Partner Violence: Unknown (12/08/2021)   Received from [facility]   HITS    Physically Hurt: Not on file    Insult or  Talk Down To: Not on file    Threaten Physical Harm: Not on file    Scream or Curse: Not on file  Darrin Koman, M.D. Ph.D.  [facility] [address], [address] [place], [place] [address] Ph: [phone] Fax: [phone]  CC:  Vernon Velna SAUNDERS, MD [address] [place],  [place] [address]  Vernon Velna SAUNDERS, MD

## 2024-09-19 NOTE — Progress Notes (Unsigned)
 SABRA

## 2024-09-20 ENCOUNTER — Telehealth: Payer: BC Managed Care – PPO | Admitting: Neurology

## 2024-09-20 DIAGNOSIS — G4733 Obstructive sleep apnea (adult) (pediatric): Secondary | ICD-10-CM | POA: Diagnosis not present

## 2024-09-20 NOTE — Progress Notes (Signed)
" ° °  Virtual Visit via Video Note  I connected with Kristine Horne on 09/20/2024 at  7:45 AM EST by a video enabled telemedicine application and verified that I am speaking with the correct person using two identifiers.  Location: Patient: at her home Provider: in the office    I discussed the limitations of evaluation and management by telemedicine and the availability of in person appointments. The patient expressed understanding and agreed to proceed.  History of Present Illness: Update 09/20/24 SS: VV, CPAP report shows 97% usage greater than 4 hours, 6-12 cmH2O, AHI 0.2, leak 4.7. doing great with CPAP, no issues. Tolerates well, couldn't sleep without CPAP. Helps her sleep well. Should be due for new CPAP machine in October 2026. Had CVA in March. Got hearing aids.   Update September 15, 2023 SS: CPAP download 08/15/2023-09/13/23 shows superb compliance at 100%.  9 hours 6 minutes. 6-12 cm.  Leak 3.1, AHI 0.3. has no issues with CPAP. Uses it all the time. Uses FFM. No health issues. A1C doing better is around 7. Has lost 20 lbs in the last year. No concerns today.   Update September 09, 2021 SS: Review of Download below shows excellent compliance, AHI is well treated at 0.4. Uses full face mask. Continues to have huge benefit from CPAP, feels much more rested, couldn't go without it. ESS 1. Continued complete resolution of right TN pain. Working with PCP to get management of glucose, on Ozempic.   Update September 09, 2022 SS: Here today for CPAP follow-up.  Data below shows excellent compliance at 100%.  AHI 0.1. Feels CPAP is doing great, sleeps very well, doesn't sleep without it. Has much more energy. Complete resolution of TN pain. No questions or concerns. Uses full face mask. Does report a high deductible for her supplies. Machine is working well.       Observations/Objective: Via virtual visit  Assessment and Plan: 1.  Severe OSA on CPAP (HST October 2021 showing severe OSA AHI 30.4/hour,  setup Oct 2021) -Excellent CPAP compliance, excellent benefit -Continue nightly usage minimum 4 hours, continue current settings -Continue to replace supplies routinely through DME -Should be eligible for a new CPAP machine in October of this year, will discuss at next visit, order HST for baseline.  Since original HST has lost 20 pounds.  However had a stroke in March 2026, likely will continue to need CPAP going forward.  Follow Up Instructions: 1 year my chart video visit    I discussed the assessment and treatment plan with the patient. The patient was provided an opportunity to ask questions and all were answered. The patient agreed with the plan and demonstrated an understanding of the instructions.   The patient was advised to call back or seek an in-person evaluation if the symptoms worsen or if the condition fails to improve as anticipated.  Lauraine Gayland MANDES, DNP  [facility] [address] [place], [place] [address] [phone]   "

## 2024-09-20 NOTE — Patient Instructions (Signed)
 Great to see you today! Continue CPAP usage minimum 4 hours nightly Continue current settings Continue to replace supplies routinely through DME Follow-up in 1 year or sooner if needed. Thanks!!

## 2025-09-26 ENCOUNTER — Telehealth: Admitting: Neurology
